# Patient Record
Sex: Female | Born: 1970 | Race: Black or African American | Hispanic: No | Marital: Single | Smoking: Never smoker
Health system: Southern US, Community
[De-identification: ages and names within clinical notes are randomized; demographics above are authoritative.]

## PROBLEM LIST (undated history)

## (undated) DIAGNOSIS — R160 Hepatomegaly, not elsewhere classified: Secondary | ICD-10-CM

## (undated) DIAGNOSIS — L309 Dermatitis, unspecified: Secondary | ICD-10-CM

## (undated) DIAGNOSIS — R519 Headache, unspecified: Secondary | ICD-10-CM

## (undated) DIAGNOSIS — L732 Hidradenitis suppurativa: Secondary | ICD-10-CM

## (undated) DIAGNOSIS — K219 Gastro-esophageal reflux disease without esophagitis: Secondary | ICD-10-CM

## (undated) DIAGNOSIS — J189 Pneumonia, unspecified organism: Secondary | ICD-10-CM

## (undated) DIAGNOSIS — K6389 Other specified diseases of intestine: Secondary | ICD-10-CM

## (undated) DIAGNOSIS — E042 Nontoxic multinodular goiter: Secondary | ICD-10-CM

## (undated) DIAGNOSIS — N200 Calculus of kidney: Secondary | ICD-10-CM

## (undated) DIAGNOSIS — K638219 Small intestinal bacterial overgrowth, unspecified: Secondary | ICD-10-CM

## (undated) HISTORY — DX: Headache, unspecified: R51.9

## (undated) HISTORY — DX: Nontoxic multinodular goiter: E04.2

## (undated) HISTORY — DX: Small intestinal bacterial overgrowth, unspecified: K63.8219

## (undated) HISTORY — DX: Gastro-esophageal reflux disease without esophagitis: K21.9

## (undated) HISTORY — DX: Hepatomegaly, not elsewhere classified: R16.0

## (undated) HISTORY — DX: Other specified diseases of intestine: K63.89

## (undated) HISTORY — DX: Pneumonia, unspecified organism: J18.9

## (undated) HISTORY — PX: ABDOMINAL HYSTERECTOMY: SHX81

---

## 2009-01-27 ENCOUNTER — Emergency Department (HOSPITAL_BASED_OUTPATIENT_CLINIC_OR_DEPARTMENT_OTHER): Admission: EM | Admit: 2009-01-27 | Discharge: 2009-01-27 | Payer: Self-pay | Admitting: Emergency Medicine

## 2009-11-22 ENCOUNTER — Emergency Department (HOSPITAL_BASED_OUTPATIENT_CLINIC_OR_DEPARTMENT_OTHER): Admission: EM | Admit: 2009-11-22 | Discharge: 2009-11-22 | Payer: Self-pay | Admitting: Emergency Medicine

## 2009-11-22 ENCOUNTER — Ambulatory Visit: Payer: Self-pay | Admitting: Diagnostic Radiology

## 2010-12-18 LAB — PREGNANCY, URINE: Preg Test, Ur: NEGATIVE

## 2010-12-18 LAB — CBC
HCT: 37.6 % (ref 36.0–46.0)
RBC: 4.55 MIL/uL (ref 3.87–5.11)
RDW: 14.1 % (ref 11.5–15.5)

## 2010-12-18 LAB — COMPREHENSIVE METABOLIC PANEL
ALT: 9 U/L (ref 0–35)
AST: 19 U/L (ref 0–37)
BUN: 8 mg/dL (ref 6–23)
Chloride: 103 mEq/L (ref 96–112)
Creatinine, Ser: 0.9 mg/dL (ref 0.4–1.2)
GFR calc Af Amer: >60 mL/min (ref >60)
GFR calc non Af Amer: >60 mL/min (ref >60)
Glucose, Bld: 64 mg/dL — ABNORMAL LOW (ref 70–99)
Sodium: 144 mEq/L (ref 135–145)
Total Bilirubin: 0.5 mg/dL (ref 0.3–1.2)
Total Protein: 8.8 g/dL — ABNORMAL HIGH (ref 6.0–8.3)

## 2010-12-18 LAB — URINALYSIS, ROUTINE W REFLEX MICROSCOPIC
Bilirubin Urine: NEGATIVE
Hgb urine dipstick: NEGATIVE
Ketones, ur: NEGATIVE mg/dL
Nitrite: NEGATIVE
Protein, ur: NEGATIVE mg/dL

## 2010-12-18 LAB — WET PREP, GENITAL: Trich, Wet Prep: NONE SEEN

## 2010-12-18 LAB — GC/CHLAMYDIA PROBE AMP, GENITAL: Chlamydia, DNA Probe: NEGATIVE

## 2010-12-18 LAB — URINE MICROSCOPIC-ADD ON

## 2010-12-18 LAB — DIFFERENTIAL
Lymphocytes Relative: 41 % (ref 12–46)
Neutrophils Relative %: 49 % (ref 43–77)

## 2010-12-30 ENCOUNTER — Observation Stay (HOSPITAL_COMMUNITY)
Admission: AD | Admit: 2010-12-30 | Discharge: 2011-01-01 | Disposition: A | Discharge status: Home / Self Care | Payer: Medicaid Other | Source: Ambulatory Visit | Attending: Obstetrics and Gynecology | Admitting: Obstetrics and Gynecology

## 2010-12-30 ENCOUNTER — Emergency Department (HOSPITAL_BASED_OUTPATIENT_CLINIC_OR_DEPARTMENT_OTHER)
Admission: EM | Admit: 2010-12-30 | Discharge: 2010-12-30 | Disposition: A | Discharge status: Home / Self Care | Payer: Medicaid Other | Attending: Emergency Medicine | Admitting: Emergency Medicine

## 2010-12-30 ENCOUNTER — Inpatient Hospital Stay (HOSPITAL_COMMUNITY): Payer: Medicaid Other

## 2010-12-30 DIAGNOSIS — B9689 Other specified bacterial agents as the cause of diseases classified elsewhere: Secondary | ICD-10-CM | POA: Insufficient documentation

## 2010-12-30 DIAGNOSIS — O2390 Unspecified genitourinary tract infection in pregnancy, unspecified trimester: Secondary | ICD-10-CM | POA: Insufficient documentation

## 2010-12-30 DIAGNOSIS — N831 Corpus luteum cyst of ovary, unspecified side: Secondary | ICD-10-CM | POA: Insufficient documentation

## 2010-12-30 DIAGNOSIS — O34599 Maternal care for other abnormalities of gravid uterus, unspecified trimester: Principal | ICD-10-CM | POA: Insufficient documentation

## 2010-12-30 DIAGNOSIS — R109 Unspecified abdominal pain: Secondary | ICD-10-CM | POA: Insufficient documentation

## 2010-12-30 DIAGNOSIS — O2690 Pregnancy related conditions, unspecified, unspecified trimester: Secondary | ICD-10-CM | POA: Insufficient documentation

## 2010-12-30 DIAGNOSIS — R1032 Left lower quadrant pain: Secondary | ICD-10-CM | POA: Insufficient documentation

## 2010-12-30 DIAGNOSIS — A499 Bacterial infection, unspecified: Secondary | ICD-10-CM | POA: Insufficient documentation

## 2010-12-30 DIAGNOSIS — N760 Acute vaginitis: Secondary | ICD-10-CM | POA: Insufficient documentation

## 2010-12-30 LAB — CBC
HCT: 34.5 % — ABNORMAL LOW (ref 36.0–46.0)
Hemoglobin: 11.5 g/dL — ABNORMAL LOW (ref 12.0–15.0)
MCH: 26.5 pg (ref 26.0–34.0)
Platelets: 323 10*3/uL (ref 150–400)
Platelets: 292 10*3/uL (ref 150–400)
RBC: 4.34 MIL/uL (ref 3.87–5.11)
WBC: 6.4 10*3/uL (ref 4.0–10.5)
WBC: 6.8 10*3/uL (ref 4.0–10.5)

## 2010-12-30 LAB — BASIC METABOLIC PANEL
BUN: 9 mg/dL (ref 6–23)
Calcium: 8.7 mg/dL (ref 8.4–10.5)
Creatinine, Ser: 0.8 mg/dL (ref 0.4–1.2)
GFR calc non Af Amer: >60 mL/min (ref >60)

## 2010-12-30 LAB — DIFFERENTIAL
Basophils Absolute: 0 10*3/uL (ref 0.0–0.1)
Basophils Relative: 0 % (ref 0–1)
Eosinophils Absolute: 0.1 10*3/uL (ref 0.0–0.7)
Eosinophils Relative: 1 % (ref 0–5)
Lymphs Abs: 3 10*3/uL (ref 0.7–4.0)
Monocytes Relative: 7 % (ref 3–12)
Neutro Abs: 2.8 10*3/uL (ref 1.7–7.7)
Neutrophils Relative %: 44 % (ref 43–77)

## 2010-12-30 LAB — URINALYSIS, ROUTINE W REFLEX MICROSCOPIC
Ketones, ur: NEGATIVE mg/dL
Protein, ur: NEGATIVE mg/dL
Specific Gravity, Urine: 1.026 (ref 1.005–1.030)

## 2010-12-30 LAB — URINE MICROSCOPIC-ADD ON

## 2010-12-30 LAB — WET PREP, GENITAL

## 2010-12-30 LAB — HCG, QUANTITATIVE, PREGNANCY: hCG, Beta Chain, Quant, S: 120 m[IU]/mL — ABNORMAL HIGH (ref <5)

## 2010-12-30 LAB — PREGNANCY, URINE: Preg Test, Ur: POSITIVE

## 2010-12-31 LAB — CBC
HCT: 33.4 % — ABNORMAL LOW (ref 36.0–46.0)
Hemoglobin: 10.4 g/dL — ABNORMAL LOW (ref 12.0–15.0)
MCHC: 31.1 g/dL (ref 30.0–36.0)
MCV: 82.9 fL (ref 78.0–100.0)
Platelets: 291 10*3/uL (ref 150–400)
RDW: 14.8 % (ref 11.5–15.5)
WBC: 6.1 10*3/uL (ref 4.0–10.5)

## 2011-01-01 LAB — CBC
Hemoglobin: 10.5 g/dL — ABNORMAL LOW (ref 12.0–15.0)
MCH: 26.4 pg (ref 26.0–34.0)
MCV: 83.4 fL (ref 78.0–100.0)
RDW: 14.8 % (ref 11.5–15.5)

## 2011-01-01 LAB — GC/CHLAMYDIA PROBE AMP, GENITAL
Chlamydia, DNA Probe: NEGATIVE
GC Probe Amp, Genital: NEGATIVE

## 2011-01-11 ENCOUNTER — Emergency Department (HOSPITAL_BASED_OUTPATIENT_CLINIC_OR_DEPARTMENT_OTHER)
Admission: EM | Admit: 2011-01-11 | Discharge: 2011-01-12 | Disposition: A | Discharge status: Home / Self Care | Payer: Medicaid Other | Attending: Emergency Medicine | Admitting: Emergency Medicine

## 2011-01-11 DIAGNOSIS — B3731 Acute candidiasis of vulva and vagina: Secondary | ICD-10-CM | POA: Insufficient documentation

## 2011-01-11 DIAGNOSIS — B373 Candidiasis of vulva and vagina: Secondary | ICD-10-CM | POA: Insufficient documentation

## 2011-01-11 DIAGNOSIS — R109 Unspecified abdominal pain: Secondary | ICD-10-CM | POA: Insufficient documentation

## 2011-01-11 LAB — URINALYSIS, ROUTINE W REFLEX MICROSCOPIC
Bilirubin Urine: NEGATIVE
Nitrite: NEGATIVE
Specific Gravity, Urine: 1.009 (ref 1.005–1.030)
Urobilinogen, UA: 0.2 mg/dL (ref 0.0–1.0)

## 2011-01-11 LAB — URINE MICROSCOPIC-ADD ON

## 2011-01-12 LAB — CBC
HCT: 33.9 % — ABNORMAL LOW (ref 36.0–46.0)
Hemoglobin: 11.3 g/dL — ABNORMAL LOW (ref 12.0–15.0)
MCH: 26.7 pg (ref 26.0–34.0)
MCHC: 33.3 g/dL (ref 30.0–36.0)
MCV: 80 fL (ref 78.0–100.0)
Platelets: 370 10*3/uL (ref 150–400)
RBC: 4.24 MIL/uL (ref 3.87–5.11)
RDW: 14.5 % (ref 11.5–15.5)
WBC: 7.7 10*3/uL (ref 4.0–10.5)

## 2011-01-12 LAB — BASIC METABOLIC PANEL
Chloride: 104 mEq/L (ref 96–112)
GFR calc Af Amer: >60 mL/min (ref >60)
Potassium: 4.4 mEq/L (ref 3.5–5.1)
Sodium: 144 mEq/L (ref 135–145)

## 2011-01-12 LAB — BASIC METABOLIC PANEL WITH GFR
BUN: 9 mg/dL (ref 6–23)
CO2: 26 meq/L (ref 19–32)
Calcium: 9.4 mg/dL (ref 8.4–10.5)
Creatinine, Ser: 0.7 mg/dL (ref 0.4–1.2)
GFR calc non Af Amer: >60 mL/min (ref >60)
Glucose, Bld: 89 mg/dL (ref 70–99)

## 2011-01-12 LAB — WET PREP, GENITAL: Trich, Wet Prep: NONE SEEN

## 2011-01-12 LAB — HCG, QUANTITATIVE, PREGNANCY: hCG, Beta Chain, Quant, S: 1962 m[IU]/mL — ABNORMAL HIGH (ref <5)

## 2011-01-12 LAB — DIFFERENTIAL
Basophils Absolute: 0 10*3/uL (ref 0.0–0.1)
Basophils Relative: 0 % (ref 0–1)
Eosinophils Absolute: 0.1 10*3/uL (ref 0.0–0.7)
Eosinophils Relative: 1 % (ref 0–5)
Lymphocytes Relative: 40 % (ref 12–46)
Lymphs Abs: 3.1 10*3/uL (ref 0.7–4.0)
Monocytes Absolute: 0.5 K/uL (ref 0.1–1.0)
Monocytes Relative: 7 % (ref 3–12)
Neutro Abs: 3.9 K/uL (ref 1.7–7.7)
Neutrophils Relative %: 51 % (ref 43–77)

## 2011-01-13 LAB — GC/CHLAMYDIA PROBE AMP, GENITAL
Chlamydia, DNA Probe: NEGATIVE
GC Probe Amp, Genital: NEGATIVE

## 2011-01-24 ENCOUNTER — Inpatient Hospital Stay (HOSPITAL_COMMUNITY)
Admission: AD | Admit: 2011-01-24 | Discharge: 2011-01-24 | Disposition: A | Discharge status: Home / Self Care | Payer: Medicaid Other | Source: Ambulatory Visit | Attending: Obstetrics and Gynecology | Admitting: Obstetrics and Gynecology

## 2011-01-24 ENCOUNTER — Inpatient Hospital Stay (HOSPITAL_COMMUNITY): Payer: Medicaid Other

## 2011-01-24 DIAGNOSIS — O200 Threatened abortion: Secondary | ICD-10-CM

## 2011-01-24 LAB — URINE MICROSCOPIC-ADD ON

## 2011-01-24 LAB — URINALYSIS, ROUTINE W REFLEX MICROSCOPIC
Leukocytes, UA: NEGATIVE
Protein, ur: NEGATIVE mg/dL
Urobilinogen, UA: 0.2 mg/dL (ref 0.0–1.0)

## 2011-01-25 LAB — URINE CULTURE: Colony Count: 4000

## 2011-01-26 ENCOUNTER — Ambulatory Visit (HOSPITAL_COMMUNITY)
Admission: AD | Admit: 2011-01-26 | Discharge: 2011-01-26 | Disposition: A | Discharge status: Home / Self Care | Payer: Medicaid Other | Source: Ambulatory Visit | Attending: Obstetrics and Gynecology | Admitting: Obstetrics and Gynecology

## 2011-01-26 ENCOUNTER — Other Ambulatory Visit: Payer: Self-pay | Admitting: Obstetrics and Gynecology

## 2011-01-26 ENCOUNTER — Ambulatory Visit (HOSPITAL_COMMUNITY)
Admission: RE | Admit: 2011-01-26 | Payer: Medicaid Other | Source: Ambulatory Visit | Admitting: Obstetrics and Gynecology

## 2011-01-26 DIAGNOSIS — O021 Missed abortion: Secondary | ICD-10-CM | POA: Insufficient documentation

## 2011-01-26 LAB — HCG, QUANTITATIVE, PREGNANCY: hCG, Beta Chain, Quant, S: 912 m[IU]/mL — ABNORMAL HIGH (ref <5)

## 2011-01-26 LAB — CBC
HCT: 35.3 % — ABNORMAL LOW (ref 36.0–46.0)
MCH: 26.4 pg (ref 26.0–34.0)
MCHC: 32 g/dL (ref 30.0–36.0)
RDW: 14.7 % (ref 11.5–15.5)

## 2011-02-01 NOTE — Op Note (Signed)
  NAME:  Christina Holmes, BACK NO.:  [id number]  MEDICAL RECORD NO.:  [id number]           PATIENT TYPE:  O  LOCATION:  [facility]                          [facility]ILITY[facility]  PHYSICIAN:  Osborn Coho, M.D.   DATE OF BIRTH:  1971/07/08  DATE OF PROCEDURE:  01/26/2011 DATE OF DISCHARGE:                              OPERATIVE REPORT   PREOPERATIVE DIAGNOSIS:  Missed abortion.  POSTOPERATIVE DIAGNOSIS:  Missed abortion.  PROCEDURE:  Suction D and C.  ATTENDING:  Osborn Coho, MD  ANESTHESIA:  MAC.  FINDINGS:  Products of conception.  SPECIMENS TO PATHOLOGY:  POCs.  FLUIDS:  500 mL.  URINE OUTPUT:  Quantity sufficient via straight cath prior to procedure.  ESTIMATED BLOOD LOSS:  Minimal.  COMPLICATIONS:  None.  PROCEDURE IN DETAIL:  The patient was taken to the operating room after the risks, benefits, and alternatives were discussed with the patient. The patient verbalized understanding.  Consent signed and witnessed. The patient was given a MAC per anesthesia and prepped and draped in normal sterile fashion in the dorsal lithotomy position.  A bivalve speculum was placed in the patient's vagina and the anterior lip of the cervix was grasped with single-tooth tenaculum.  A paracervical block was administered using a total of 10 mL of 2% lidocaine.  The uterus was sounded to about 8.5 cm and a size 7 suction curette was used.  Suction curettage was performed until minimal tissue returned.  Sharp curettage was performed until a gritty texture was noted.  Suction curettage was performed once again to remove any remaining debris.  All instruments were removed.  Count was correct. The patient tolerated the procedure well and was awaiting transfer to recovery room in good condition.     Osborn Coho, M.D.     AR/MEDQ  D:  01/26/2011  T:  Feb 17, 2011  Job:  [id number]  Electronically Signed by Osborn Coho M.D. on 02/01/2011 07:40:30 AM

## 2011-02-01 NOTE — Discharge Summary (Signed)
NAMEMarland Kitchen  Christina Holmes, Christina Holmes NO.:  [id number]  MEDICAL RECORD NO.:  [id number]           PATIENT TYPE:  O  LOCATION:  9320                          [facility]ACILITY:  [facility]  PHYSICIAN:  Janine Limbo, M.D.DATE OF BIRTH:  09-Feb-1971  DATE OF ADMISSION:  12/30/2010 DATE OF DISCHARGE:  01/01/2011                              DISCHARGE SUMMARY   ADMISSION DIAGNOSES: 1. Left lower quadrant pain. 2. A 4-week and 2-day gestation [facility] is September 05, 2011). 3. Anemia. 4. No prenatal care to date.  POSTOPERATIVE DIAGNOSES: 1. Left lower quadrant pain - now resolved. 2. A 4-week and 2-day gestation [facility] is September 05, 2011). 3. Anemia. 4. No prenatal care to date.  HISTORY OF PRESENT ILLNESS:  Christina Holmes is a 40 year old female, gravida 4, para 3-0-0-3, who presented for admission because of left lower quadrant pain.  [facility] (a Division of [facility]) was on-call for the emergency department and we were asked to see the patient.  The patient reports that her pain was of 2-day gestation and was severe.  She rated her pain as 7/10.  The patient's last menstrual period was on November 30, 2010.  She denied abnormal bleeding.  ADMISSION EXAM:  The patient's exam was significant for a patient who seemed to be in moderate distress because of pain.  The patient's pelvic exam was uneventful.  No adnexal masses were appreciated.  HOSPITAL COURSE:  The patient was admitted to the hospital and given pain medications as pills and through her IV line.  Her admission white blood cell count was 6800.  Her admission hemoglobin was 11.2 and her admission hematocrit was 34.7%.  Gonorrhea and Chlamydia cultures were negative.  The patient had a quantitative HCG performed and it was 120. The patient's blood type was B positive.  The patient was given IV fluids.  She was also given IV metronidazole because of vaginosis.  The patient quickly  began to feel better and by January 01, 2011, she felt that she was ready for discharge.  Her exam was stable.  Her pain had resolved.  Her quantitative beta HCG on December 31, 2010, was 165 and on January 01, 2011, was 220.  Her hemoglobin remained stable.  We felt that the patient was ready for discharge.  DISCHARGE MEDICATIONS: 1. Ibuprofen 600 mg every 6 hours as needed for pain. 2. Percocet 5/325, 1 or 2 tablets every 4 hours as needed for pain (20     tablets with no refills). 3. Zofran 8 mg tabs, she will take 1 tablet every 8 hours as needed     for nausea. 4. Phenergan 25 mg 1 tablet every 6 hours as needed for nausea.  DISCHARGE INSTRUCTIONS:  The patient was given a note stating that she was 4 weeks' and 4 days' pregnant with a due date of September 05, 2011. She will apply for Medicaid.  The patient was told to follow up either in our office or at the [facility] Department in 3 weeks for followup examination to begin her prenatal care.  She will take daily prenatal vitamins.  She will call for questions or concerns.     Janine Limbo, M.D.     AVS/MEDQ  D:  01/01/2011  T:  01/02/2011  Job:  [id number]  Electronically Signed by Kirkland Hun M.D. on 02/01/2011 03:10:35 AM

## 2011-02-02 ENCOUNTER — Inpatient Hospital Stay (HOSPITAL_COMMUNITY)
Admission: AD | Admit: 2011-02-02 | Discharge: 2011-02-02 | Disposition: A | Discharge status: Home / Self Care | Payer: Medicaid Other | Source: Ambulatory Visit | Attending: Obstetrics and Gynecology | Admitting: Obstetrics and Gynecology

## 2011-02-02 DIAGNOSIS — O021 Missed abortion: Secondary | ICD-10-CM | POA: Insufficient documentation

## 2011-02-02 LAB — HCG, QUANTITATIVE, PREGNANCY: hCG, Beta Chain, Quant, S: 9 m[IU]/mL — ABNORMAL HIGH (ref <5)

## 2011-02-23 DEATH — deceased

## 2011-04-07 ENCOUNTER — Encounter: Payer: Self-pay | Admitting: *Deleted

## 2011-04-07 ENCOUNTER — Emergency Department (INDEPENDENT_AMBULATORY_CARE_PROVIDER_SITE_OTHER): Payer: Medicaid Other

## 2011-04-07 ENCOUNTER — Emergency Department (HOSPITAL_BASED_OUTPATIENT_CLINIC_OR_DEPARTMENT_OTHER)
Admission: EM | Admit: 2011-04-07 | Discharge: 2011-04-07 | Disposition: A | Discharge status: Home / Self Care | Payer: Medicaid Other | Attending: Emergency Medicine | Admitting: Emergency Medicine

## 2011-04-07 DIAGNOSIS — R10814 Left lower quadrant abdominal tenderness: Secondary | ICD-10-CM | POA: Insufficient documentation

## 2011-04-07 DIAGNOSIS — R109 Unspecified abdominal pain: Secondary | ICD-10-CM

## 2011-04-07 DIAGNOSIS — R350 Frequency of micturition: Secondary | ICD-10-CM | POA: Insufficient documentation

## 2011-04-07 DIAGNOSIS — N2889 Other specified disorders of kidney and ureter: Secondary | ICD-10-CM

## 2011-04-07 DIAGNOSIS — N210 Calculus in bladder: Secondary | ICD-10-CM

## 2011-04-07 DIAGNOSIS — R112 Nausea with vomiting, unspecified: Secondary | ICD-10-CM

## 2011-04-07 DIAGNOSIS — N200 Calculus of kidney: Secondary | ICD-10-CM | POA: Insufficient documentation

## 2011-04-07 DIAGNOSIS — R1032 Left lower quadrant pain: Secondary | ICD-10-CM | POA: Insufficient documentation

## 2011-04-07 DIAGNOSIS — R300 Dysuria: Secondary | ICD-10-CM | POA: Insufficient documentation

## 2011-04-07 DIAGNOSIS — R42 Dizziness and giddiness: Secondary | ICD-10-CM | POA: Insufficient documentation

## 2011-04-07 LAB — DIFFERENTIAL
Basophils Absolute: 0 10*3/uL (ref 0.0–0.1)
Basophils Relative: 0 % (ref 0–1)
Eosinophils Absolute: 0 10*3/uL (ref 0.0–0.7)
Eosinophils Relative: 1 % (ref 0–5)

## 2011-04-07 LAB — CBC
MCH: 26.2 pg (ref 26.0–34.0)
MCHC: 33.1 g/dL (ref 30.0–36.0)
MCV: 79.4 fL (ref 78.0–100.0)
Platelets: 279 10*3/uL (ref 150–400)
RDW: 13.8 % (ref 11.5–15.5)
WBC: 6.3 10*3/uL (ref 4.0–10.5)

## 2011-04-07 LAB — URINALYSIS, ROUTINE W REFLEX MICROSCOPIC
Bilirubin Urine: NEGATIVE
Ketones, ur: NEGATIVE mg/dL
Nitrite: NEGATIVE
pH: 6.5 (ref 5.0–8.0)

## 2011-04-07 LAB — URINE MICROSCOPIC-ADD ON

## 2011-04-07 LAB — BASIC METABOLIC PANEL
Calcium: 9.3 mg/dL (ref 8.4–10.5)
GFR calc non Af Amer: >60 mL/min (ref >60)
Sodium: 139 mEq/L (ref 135–145)

## 2011-04-07 MED ORDER — OXYCODONE-ACETAMINOPHEN 5-325 MG PO TABS: 2.0000 | ORAL_TABLET | ORAL | Status: AC | PRN

## 2011-04-07 MED ORDER — HYDROMORPHONE HCL 1 MG/ML IJ SOLN: 2.0000 mg | Freq: Once | INTRAMUSCULAR | Status: DC

## 2011-04-07 MED ORDER — IBUPROFEN 800 MG PO TABS: 800.0000 mg | ORAL_TABLET | Freq: Three times a day (TID) | ORAL | Status: AC | PRN

## 2011-04-07 MED ORDER — ONDANSETRON 4 MG PO TBDP
4.0000 mg | ORAL_TABLET | Freq: Once | ORAL | Status: AC
  Administered 2011-04-07: 4 mg via ORAL
  Filled 2011-04-07: qty 1

## 2011-04-07 MED ORDER — KETOROLAC TROMETHAMINE 60 MG/2ML IM SOLN
60.0000 mg | Freq: Once | INTRAMUSCULAR | Status: AC
  Administered 2011-04-07: 60 mg via INTRAMUSCULAR
  Filled 2011-04-07: qty 2

## 2011-04-07 MED ORDER — SODIUM CHLORIDE 0.9 % IV BOLUS (SEPSIS)
1000.0000 mL | Freq: Once | INTRAVENOUS | Status: AC
  Administered 2011-04-07: 1000 mL via INTRAVENOUS

## 2011-04-07 MED ORDER — ONDANSETRON HCL 4 MG/2ML IJ SOLN: 4.0000 mg | Freq: Once | INTRAMUSCULAR | Status: DC

## 2011-04-07 MED ORDER — HYDROMORPHONE HCL 1 MG/ML IJ SOLN
1.0000 mg | INTRAMUSCULAR | Status: DC | PRN
  Administered 2011-04-07: 1 mg via INTRAVENOUS
  Filled 2011-04-07: qty 1

## 2011-04-07 MED ORDER — ONDANSETRON HCL 4 MG PO TABS: 8.0000 mg | ORAL_TABLET | Freq: Three times a day (TID) | ORAL | Status: AC | PRN

## 2011-04-07 MED ORDER — IOHEXOL 300 MG/ML  SOLN
100.0000 mL | Freq: Once | INTRAMUSCULAR | Status: AC | PRN
  Administered 2011-04-07: 100 mL via INTRAVENOUS

## 2011-04-07 NOTE — ED Notes (Signed)
Pt aware of plans for CT abdomen. Call bell within reach and instructed to notify staff if pain/nausea worsens. Verbalizes understanding.

## 2011-04-07 NOTE — ED Provider Notes (Addendum)
History     Chief Complaint  Patient presents with  . Abdominal Pain  . Nausea  . Emesis   Patient is a 40 y.o. female presenting with abdominal pain and vomiting. The history is provided by the patient and medical records.  Abdominal Pain The primary symptoms of the illness include abdominal pain, nausea, vomiting and dysuria. The primary symptoms of the illness do not include fever, shortness of breath, diarrhea, hematemesis, hematochezia, vaginal discharge or vaginal bleeding. Episode onset: 1 AM. The onset of the illness was sudden. The problem has not changed since onset. The pain came on suddenly. The abdominal pain has been unchanged since its onset. The abdominal pain is located in the LLQ. The abdominal pain radiates to the left flank. The severity of the abdominal pain is 8/10. The abdominal pain is relieved by nothing.  The dysuria is associated with frequency. The dysuria is not associated with hematuria.  The patient states that she believes she is currently not pregnant. The patient has not had a change in bowel habit. Additional symptoms associated with the illness include frequency. Symptoms associated with the illness do not include chills, hematuria or back pain. Associated medical issues comments: Recent dilation and evacuation for miscarriage.  Emesis  Associated symptoms include abdominal pain. Pertinent negatives include no chills, no diarrhea, no fever and no headaches.    History reviewed. No pertinent past medical history.  History reviewed. No pertinent past surgical history.  History reviewed. No pertinent family history.  History  Substance Use Topics  . Smoking status: Never Smoker   . Smokeless tobacco: Not on file  . Alcohol Use: No    OB History    Grav Para Term Preterm Abortions TAB SAB Ect Mult Living                  Review of Systems  Constitutional: Negative for fever and chills.  HENT: Negative for neck pain.   Eyes: Negative for visual  disturbance.  Respiratory: Negative for shortness of breath.   Cardiovascular: Negative for chest pain.  Gastrointestinal: Positive for nausea, vomiting and abdominal pain. Negative for diarrhea, hematochezia and hematemesis.  Genitourinary: Positive for dysuria and frequency. Negative for hematuria, vaginal bleeding, vaginal discharge and menstrual problem.  Musculoskeletal: Negative for back pain.  Skin: Negative for rash.  Neurological: Positive for dizziness. Negative for headaches.  Hematological: Negative for adenopathy.    Physical Exam  BP 124/97  Pulse 98  Temp(Src) 97.7 F (36.5 C) (Oral)  SpO2 100%  Physical Exam  Nursing note and vitals reviewed. Constitutional: She appears well-developed and well-nourished. Distressed:  Uncomfortable appearing.  HENT:  Head: Normocephalic and atraumatic.  Mouth/Throat: Oropharynx is clear and moist. No oropharyngeal exudate.  Eyes: Conjunctivae and EOM are normal. Pupils are equal, round, and reactive to light. Right eye exhibits no discharge. Left eye exhibits no discharge. No scleral icterus.  Neck: Normal range of motion. Neck supple. No JVD present. No thyromegaly present.  Cardiovascular: Normal rate, regular rhythm, normal heart sounds and intact distal pulses.  Exam reveals no gallop and no friction rub.   No murmur heard. Pulmonary/Chest: Effort normal and breath sounds normal. No respiratory distress. She has no wheezes. She has no rales.  Abdominal: Soft. Bowel sounds are normal. She exhibits no distension and no mass. There is Tenderness:  Tender to palpation in the left lower quadrant, suprapubic area.. There is no CVA tenderness.       No tenderness in right lower cultures,  McBurney's point, no Murphy sign, non-peritoneal, no guarding, no upper normal tenderness at all.  Musculoskeletal: Normal range of motion. She exhibits no edema and no tenderness.  Lymphadenopathy:    She has no cervical adenopathy.  Neurological: She  is alert. Coordination normal.  Skin: Skin is warm and dry. No rash noted. She is not diaphoretic. No erythema.  Psychiatric: She has a normal mood and affect. Her behavior is normal.     ED Course  Procedures  MDM The patient appears in mild discomfort and has reproducible symptoms to palpation of the left lower abdomen. Will get urinalysis to rule out urine infection and to evaluate for hematuria. Toradol intramuscular given. Will do further evaluation if urinalysis negative. Would also consider ovarian cyst or kidney stone.   Urinalysis negative, pelvic exam performed under chaperone supervision by nurse Lurena Joiner. Patient has bilateral adnexal tenderness as well as pain over her uterus and cervix. There is no discharge or bleeding in the vaginal vault. Due to the ongoing pain and lack of ultrasound availability at this facility at this time of day, will get a CT scan to rule out other sources of pain. Dilaudid IV ordered as well as blood work.   Review of medical record shows that patient has had persistently negative GC and Chlamydia swabs but has had persistently positive bacterial vaginosis over the last several pelvic exams. She denies sexual activity since her miscarriage in the last 2 months.  Change of shift - care signed out to Dr. Fredricka Bonine.  Vida Roller, MD 04/07/11 [phone]  The patient has a chief complaint of abdominal pain left lower quadrant and suprapubic in location, and at this time is awaiting results of CT scan to evaluate for ureteric lithiasis or other abdominal pathology. If CT scan is negative, Dr. Hyacinth Meeker informs me that the patient should be discharged home with the diagnosis of nonspecific abdominal pain. I will followup on the results of the CT scan for the continued care of this patient.  Felisa Bonier, MD 04/07/11 0802  CT scan reviewed by myself as well as the radiologist, finding obstructive uropathy due to an approximately 5 mm ureteric stone now located  just inside the bladder lumen.  Felisa Bonier, MD 04/07/11 [phone]  I have conveyed the results of testing to the patient and given her diagnosis of ureteric stone, however she is at this time in approximately 9/10 intensity pain, and nauseated. I will order her repeat doses of analgesia and antiemetics to try and help her to get more comfortable prior to discharge home with urology followup.  Felisa Bonier, MD 04/07/11 [phone]  At this time the patient reports the pain has improved spontaneously, and she actually declined to be ordered pain medication for her. She requested that her IV be removed and that she be discharged at this time.  Felisa Bonier, MD 04/07/11 [phone]

## 2011-04-07 NOTE — ED Notes (Signed)
Patient is resting comfortably. No distress noted at this time.

## 2011-04-07 NOTE — ED Notes (Signed)
Pt reports urinary frequency x1week. Denies other urinary symptoms or vaginal discharge. Approx 4hours ago, had sudden onset of LLQ, left flank and suprapubic pain. Described as sharp.

## 2011-04-07 NOTE — ED Notes (Signed)
Pt very anxious and dry heaving in room. Pt encouraged on relaxation techniques and to slow breathing.

## 2011-04-09 ENCOUNTER — Emergency Department (HOSPITAL_BASED_OUTPATIENT_CLINIC_OR_DEPARTMENT_OTHER)
Admission: EM | Admit: 2011-04-09 | Discharge: 2011-04-09 | Discharge status: Against Medical Advice | Payer: Medicaid Other | Attending: Emergency Medicine | Admitting: Emergency Medicine

## 2011-04-09 ENCOUNTER — Emergency Department (INDEPENDENT_AMBULATORY_CARE_PROVIDER_SITE_OTHER): Payer: Medicaid Other

## 2011-04-09 ENCOUNTER — Encounter (HOSPITAL_BASED_OUTPATIENT_CLINIC_OR_DEPARTMENT_OTHER): Payer: Self-pay | Admitting: *Deleted

## 2011-04-09 DIAGNOSIS — N201 Calculus of ureter: Secondary | ICD-10-CM

## 2011-04-09 DIAGNOSIS — R109 Unspecified abdominal pain: Secondary | ICD-10-CM

## 2011-04-09 DIAGNOSIS — N200 Calculus of kidney: Secondary | ICD-10-CM | POA: Insufficient documentation

## 2011-04-09 HISTORY — DX: Calculus of kidney: N20.0

## 2011-04-09 LAB — GC/CHLAMYDIA PROBE AMP, GENITAL
Chlamydia, DNA Probe: NEGATIVE
GC Probe Amp, Genital: NEGATIVE

## 2011-04-09 LAB — URINALYSIS, ROUTINE W REFLEX MICROSCOPIC
Bilirubin Urine: NEGATIVE
Nitrite: NEGATIVE
Specific Gravity, Urine: 1.011 (ref 1.005–1.030)
pH: 6 (ref 5.0–8.0)

## 2011-04-09 LAB — URINE MICROSCOPIC-ADD ON

## 2011-04-09 MED ORDER — HYDROMORPHONE HCL 2 MG/ML IJ SOLN
2.0000 mg | Freq: Once | INTRAMUSCULAR | Status: DC
  Filled 2011-04-09: qty 1

## 2011-04-09 MED ORDER — SODIUM CHLORIDE 0.9 % IV SOLN
Freq: Once | INTRAVENOUS | Status: AC
  Administered 2011-04-09: 02:00:00 via INTRAVENOUS

## 2011-04-09 NOTE — ED Notes (Signed)
MD at bedside. 

## 2011-04-09 NOTE — ED Notes (Signed)
Pt states that she was seen on Sat and dx with kidney stone pt states that pain began and worsened today around 1600 pt also with irritated left eye

## 2011-04-09 NOTE — ED Notes (Signed)
Went to pt's room to give her Dilaudid and to draw her labs. She stated that she did not want a shot, nor did she want any labs drawn. She did agree to the CT because she "wants to see if that thing is still inside her." Dr. Algis Downs and in to talk with pt.

## 2011-04-09 NOTE — ED Provider Notes (Signed)
History     Chief Complaint  Patient presents with  . Flank Pain   Patient is a 40 y.o. female presenting with flank pain. The history is provided by the patient.  Flank Pain This is a new problem. The current episode started 2 days ago. The problem occurs constantly. The problem has not changed since onset.Associated symptoms include abdominal pain. Pertinent negatives include no chest pain and no shortness of breath. The symptoms are aggravated by nothing. The symptoms are relieved by nothing.  SEEN Saturday FOR SAME AND CT DEPICTED LEFT 4 MM KIDNEY STONE IN DISTAL URETER AT BLADDER. PAIN HAS CONTINUED. HAS BEEN TAKING PAIN MEDS WITH SOME RELIEF. HERE BECAUSE STILL NOT BETTER.   Past Medical History  Diagnosis Date  . Kidney calculi     History reviewed. No pertinent past surgical history.  History reviewed. No pertinent family history.  History  Substance Use Topics  . Smoking status: Never Smoker   . Smokeless tobacco: Not on file  . Alcohol Use: No    OB History    Grav Para Term Preterm Abortions TAB SAB Ect Mult Living                  Review of Systems  Constitutional: Negative for fever.  HENT: Negative for congestion and neck pain.   Respiratory: Negative for chest tightness and shortness of breath.   Cardiovascular: Negative for chest pain.  Gastrointestinal: Positive for nausea and abdominal pain. Negative for vomiting.  Genitourinary: Positive for flank pain. Negative for difficulty urinating.  Musculoskeletal: Positive for back pain.  Skin: Negative for rash.  Neurological: Negative for weakness.    Physical Exam  BP 114/86  Pulse 90  Temp(Src) 97.9 F (36.6 C) (Oral)  Resp 20  SpO2 100%  Physical Exam  Constitutional: She is oriented to person, place, and time. She appears well-developed and well-nourished.  HENT:  Head: Normocephalic and atraumatic.  Mouth/Throat: Oropharynx is clear and moist.  Eyes: Conjunctivae and EOM are normal. Pupils  are equal, round, and reactive to light.  Neck: Normal range of motion. Neck supple.  Cardiovascular: Normal rate, regular rhythm, normal heart sounds and intact distal pulses.   Pulmonary/Chest: Effort normal and breath sounds normal.  Abdominal: Soft. Bowel sounds are normal. There is no tenderness.  Musculoskeletal: Normal range of motion. She exhibits no edema.  Neurological: She is alert and oriented to person, place, and time. No cranial nerve deficit. She exhibits normal muscle tone.  Skin: Skin is warm and dry. No rash noted.    ED Course  Procedures Results for orders placed during the hospital encounter of 04/09/11  URINALYSIS, ROUTINE W REFLEX MICROSCOPIC      Component Value Range   Color, Urine YELLOW  YELLOW    Appearance CLEAR  CLEAR    Specific Gravity, Urine 1.011  1.005 - 1.030    pH 6.0  5.0 - 8.0    Glucose, UA NEGATIVE  NEGATIVE (mg/dL)   Hgb urine dipstick MODERATE (*) NEGATIVE    Bilirubin Urine NEGATIVE  NEGATIVE    Ketones, ur NEGATIVE  NEGATIVE (mg/dL)   Protein, ur NEGATIVE  NEGATIVE (mg/dL)   Urobilinogen, UA 0.2  0.0 - 1.0 (mg/dL)   Nitrite NEGATIVE  NEGATIVE    Leukocytes, UA NEGATIVE  NEGATIVE   URINE MICROSCOPIC-ADD ON      Component Value Range   Squamous Epithelial / LPF FEW (*) RARE    WBC, UA 0-2  <3 (WBC/hpf)  RBC / HPF 7-10  <3 (RBC/hpf)   Bacteria, UA FEW (*) RARE    Ct Abdomen Pelvis Wo Contrast  04/09/2011  *RADIOLOGY REPORT*  Clinical Data: 40 year old female with flank pain.  Recent diagnosis of obstructive uropathy due to left UVJ stone.  CT ABDOMEN AND PELVIS WITHOUT CONTRAST  Technique:  Multidetector CT imaging of the abdomen and pelvis was performed following the standard protocol without intravenous contrast.  Comparison: 04/07/2011.  Findings: Lung base is not included. No acute osseous abnormality identified.  No pelvic free fluid.  Stable appearance of the uterus and adnexa.  Negative distal colon.  Negative proximal colon, cecum  and appendix.  No dilated small bowel.  Decompressed stomach. Visualized noncontrast liver, spleen, pancreas, and adrenal glands are within normal limits.  There is now hyperdensity within the gallbladder, favor vicarious excretion of recent IV contrast.  Negative right kidney and ureter.  Continued obstruction of the left kidney with mild perinephric stranding.  Continued left hydroureter, ureter diameter up to 9 mm. Persistent obstructing calculus at the left ureteral vesicle junction measuring 4 mm in diameter.  Negative bladder otherwise.  IMPRESSION: Unchanged 4 mm UVJ stone causing obstruction of the left kidney and ureter.  Original Report Authenticated By: Harley Hallmark, M.D.   Ct Abdomen Pelvis W Contrast  04/07/2011  *RADIOLOGY REPORT*  Clinical Data:  Abdominal pain, nausea, vomiting  CT ABDOMEN AND PELVIS WITH CONTRAST  Technique:  Multidetector CT imaging of the abdomen and pelvis was performed using the standard protocol following bolus administration of intravenous contrast.  Contrast: 100 ml Omnipaque-300  Comparison:  None.  Findings:  The visualized lung bases are clear.  Bone windows reveal no acute musculoskeletal abnormalities.  The liver, gallbladder, spleen, pancreas, adrenal glands, aorta, and right kidney are normal.  Bowel is normal.  Reproductive organs are normal.  There is a 1 cm right ovarian follicle.  There is no ascites.  There is a small volume of perinephric fluid on the left.  There is perinephric inflammatory change.  There is moderate to severe dilatation of the left ureter throughout its course.  Within the bladder lumen just inside the ureteral vesicle junction, there is a 5 mm calculus.  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  Original Report Authenticated By: [id number]   MDM LEFT URETERAL STONE WITH OBSTRUCTION AND NO MOVEMENT SINCE Saturday. PATIENT HAS CONSISITENTLY  REFUSED LABS TO EVAL WBC FOR INFECTION AND RENAL FUNCTION. EXPLAINED THAT IN THE FACE OF THE OBSTRUCTION THIS IS EXTREMELY IMPORTANT SHE UNDERSTANDS AND DOES NOT WANT IT. STATES SHE WILL FOLLOW UP WITH UROLOGY. PAIN MEDS OFFERED BUT REFUSED.       Shelda Jakes, MD 04/09/11 [phone]

## 2011-05-02 ENCOUNTER — Encounter (HOSPITAL_BASED_OUTPATIENT_CLINIC_OR_DEPARTMENT_OTHER): Payer: Self-pay | Admitting: *Deleted

## 2011-05-02 ENCOUNTER — Emergency Department (HOSPITAL_BASED_OUTPATIENT_CLINIC_OR_DEPARTMENT_OTHER)
Admission: EM | Admit: 2011-05-02 | Discharge: 2011-05-02 | Disposition: A | Discharge status: Home / Self Care | Payer: Medicaid Other | Attending: Emergency Medicine | Admitting: Emergency Medicine

## 2011-05-02 DIAGNOSIS — IMO0001 Reserved for inherently not codable concepts without codable children: Secondary | ICD-10-CM | POA: Insufficient documentation

## 2011-05-02 DIAGNOSIS — S40862A Insect bite (nonvenomous) of left upper arm, initial encounter: Secondary | ICD-10-CM

## 2011-05-02 MED ORDER — DIPHENHYDRAMINE HCL 25 MG PO CAPS
25.0000 mg | ORAL_CAPSULE | Freq: Once | ORAL | Status: AC
  Administered 2011-05-02: 25 mg via ORAL
  Filled 2011-05-02 (×2): qty 1

## 2011-05-02 MED ORDER — METHYLPREDNISOLONE SODIUM SUCC 125 MG IJ SOLR
125.0000 mg | Freq: Once | INTRAMUSCULAR | Status: AC
  Administered 2011-05-02: 125 mg via INTRAMUSCULAR
  Filled 2011-05-02: qty 2

## 2011-05-02 MED ORDER — HYDROCODONE-ACETAMINOPHEN 5-325 MG PO TABS
1.0000 | ORAL_TABLET | Freq: Once | ORAL | Status: AC
  Administered 2011-05-02: 1 via ORAL
  Filled 2011-05-02: qty 1

## 2011-05-02 NOTE — ED Provider Notes (Signed)
History     CSN: [id number] Arrival date & time: 05/02/2011  9:01 PM  Chief Complaint  Patient presents with  . Insect Bite   HPI Comments: Pt states that she hasn't tried anything and she is having redness and swelling to the area  Patient is a 40 y.o. female presenting with animal bite. The history is provided by the patient. No language interpreter was used.  Animal Bite  The incident occurred today. There is an injury to the left upper arm. The pain is moderate. It is unlikely that a foreign body is present. Pertinent negatives include no nausea, no vomiting, no cough and no difficulty breathing.    Past Medical History  Diagnosis Date  . Kidney calculi     History reviewed. No pertinent past surgical history.  History reviewed. No pertinent family history.  History  Substance Use Topics  . Smoking status: Never Smoker   . Smokeless tobacco: Not on file  . Alcohol Use: No    OB History    Grav Para Term Preterm Abortions TAB SAB Ect Mult Living                  Review of Systems  Constitutional: Negative.   Respiratory: Negative for cough.   Gastrointestinal: Negative for nausea and vomiting.  All other systems reviewed and are negative.    Physical Exam  BP 116/66  Pulse 72  Temp 98.3 F (36.8 C)  Resp 16  Wt 180 lb (81.647 kg)  SpO2 100%  LMP 04/05/2011  Physical Exam  Constitutional: She appears well-developed and well-nourished.  HENT:  Head: Normocephalic and atraumatic.  Cardiovascular: Normal rate and regular rhythm.   Pulmonary/Chest: Effort normal.  Musculoskeletal: Normal range of motion.  Neurological: She is alert.  Skin:       ED Course  Procedures  MDM Pt has a localized reaction to the left arm  Medical screening examination/treatment/procedure(s) were performed by non-physician practitioner and as supervising physician I was immediately available for consultation/collaboration.    Teressa Lower, NP 05/02/11  2228  Shelda Jakes, MD 05/13/11 [phone]

## 2011-05-02 NOTE — ED Notes (Signed)
Pt c/o bee sting to left arm x 45 mins ago

## 2012-02-10 IMAGING — US US OB TRANSVAGINAL
1 series · 13 of 28 positions shown · non-contrast
Comparison: none

[Series 1: us ob comp less 14 wks · 13 of 41 slices shown]
[im 2/41]
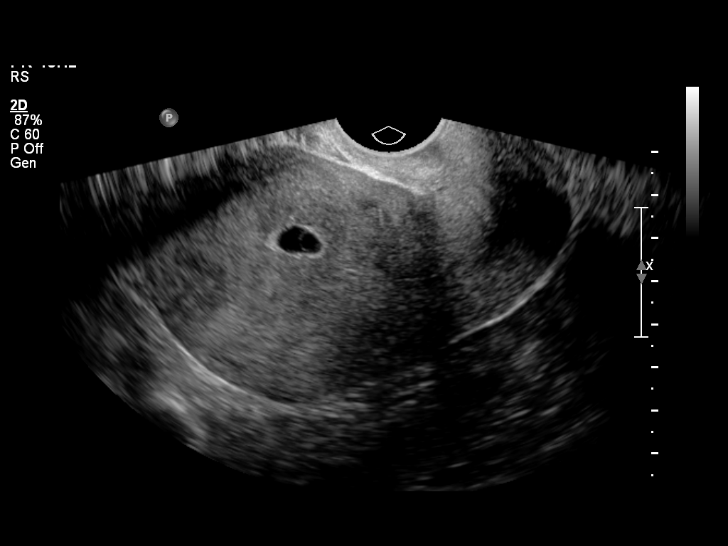
[im 5/41]
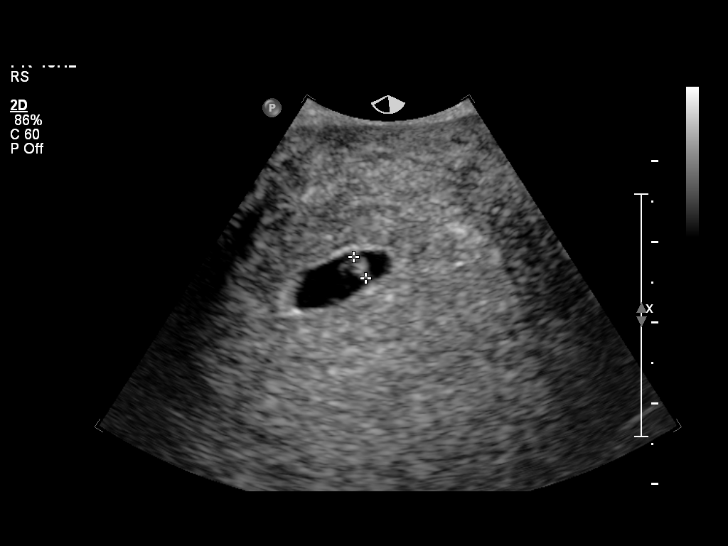
[im 8/41]
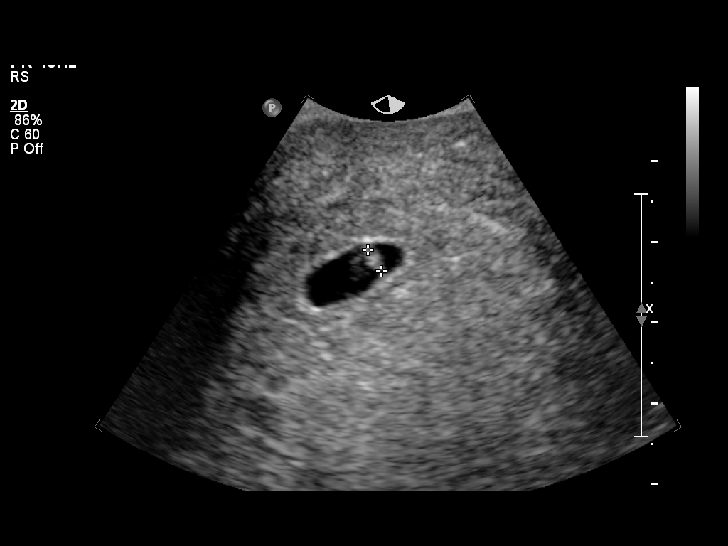
[im 11/41]
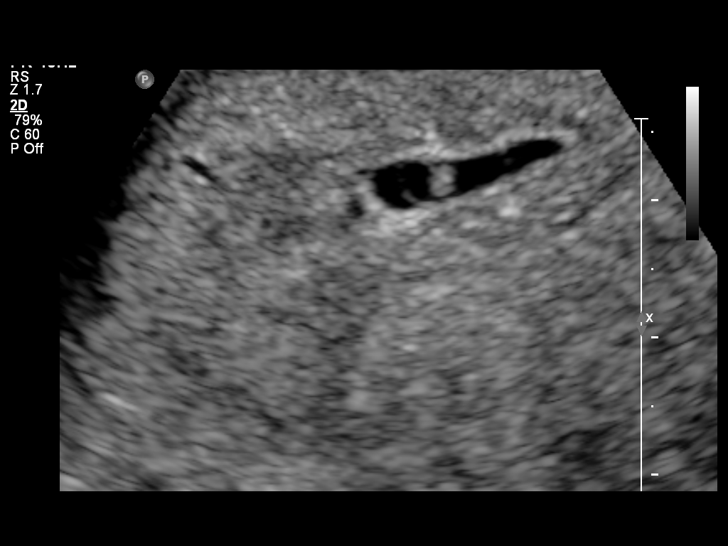
[im 14/41]
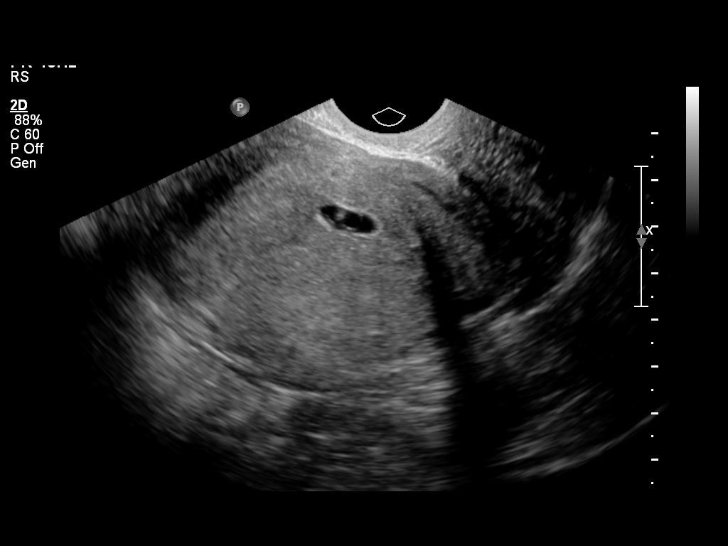
[im 17/41]
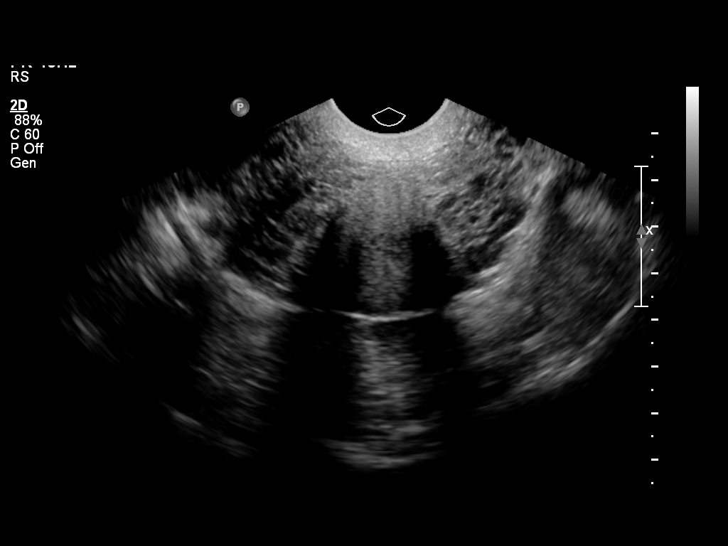
[im 21/41]
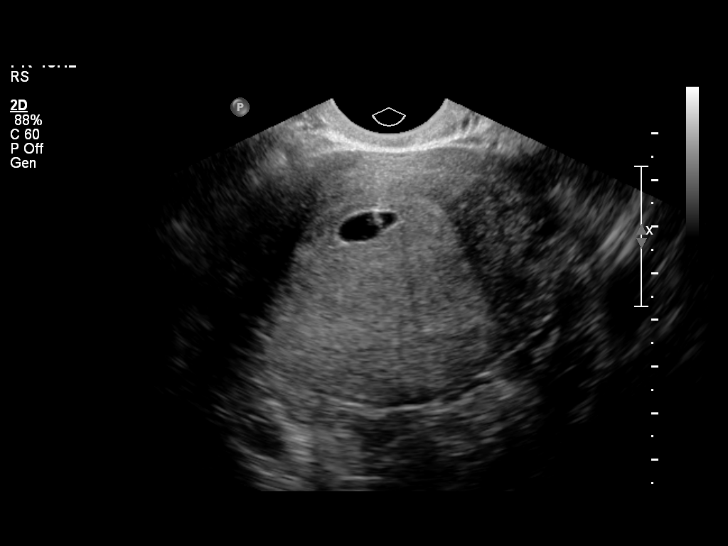
[im 24/41]
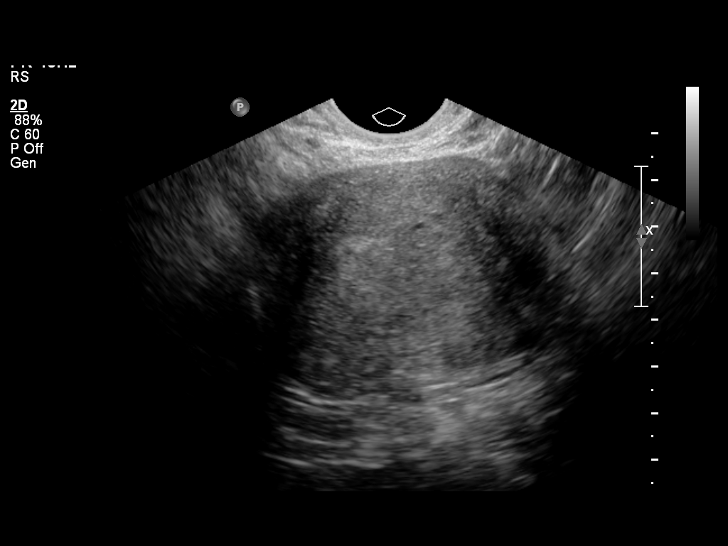
[im 27/41]
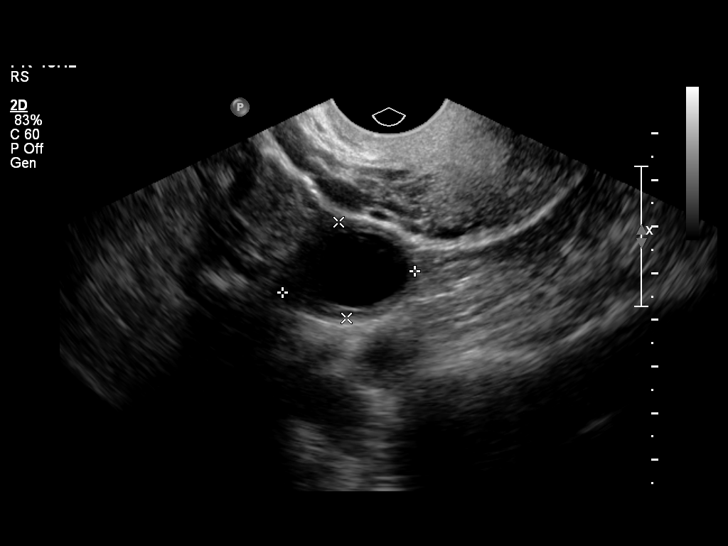
[im 30/41]
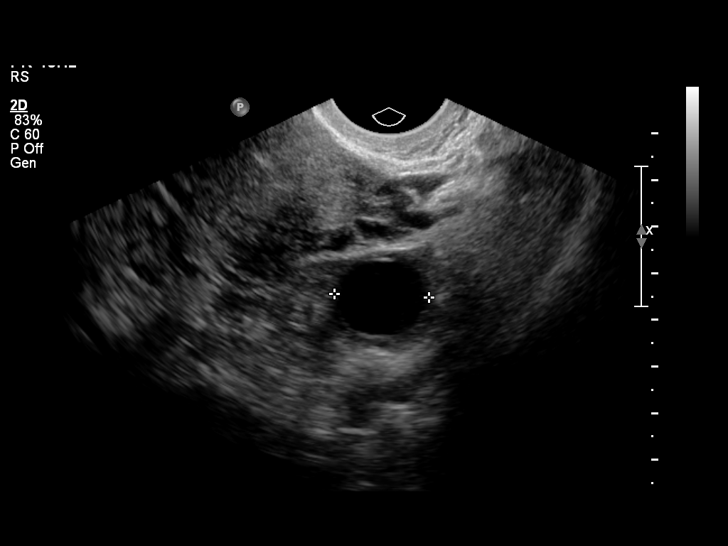
[im 33/41]
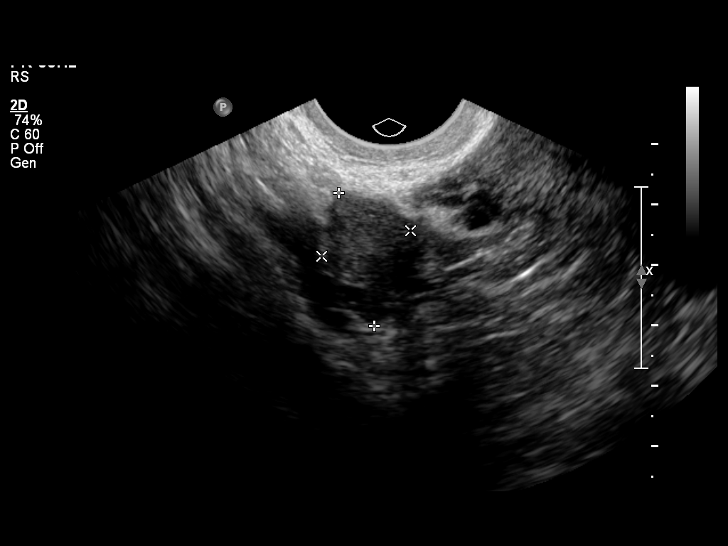
[im 36/41]
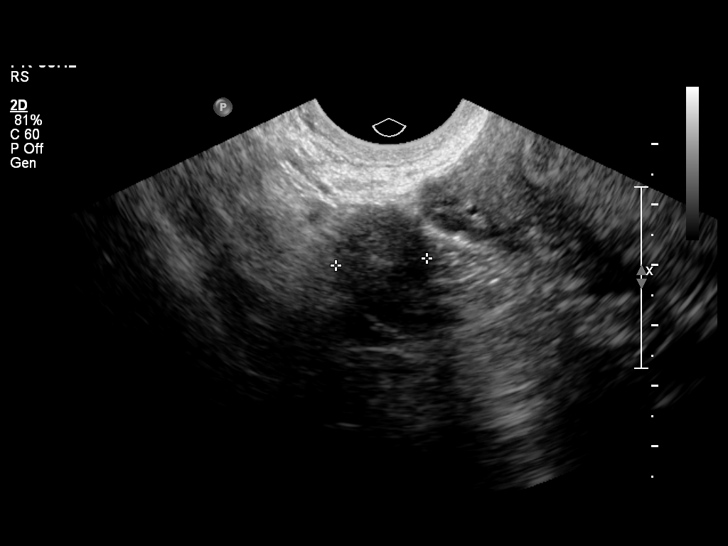
[im 39/41]
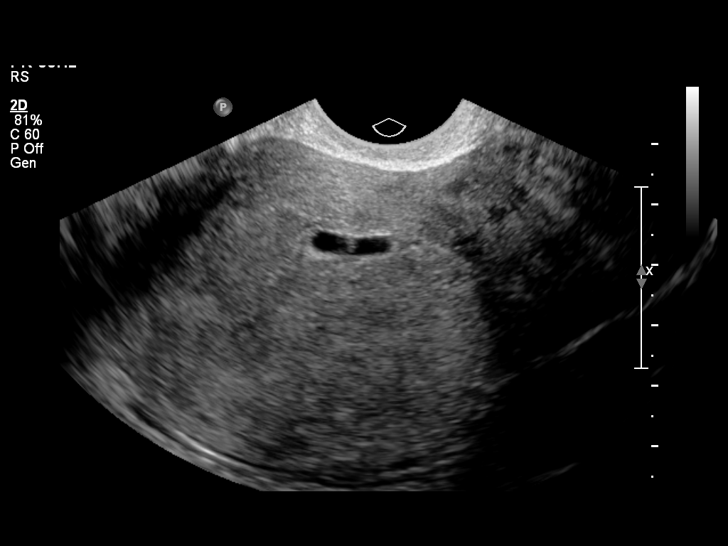

[13 of 28 positions shown; findings below may reference images not displayed]

OBSTETRICS REPORT
                      (Signed Final 01/24/2011 [DATE])

Procedures

 US OB TRANSVAGINAL                                    76817.0
Indications

 Pelvic / Abdominal pain
 Vaginal bleeding, unknown etiology
Fetal Evaluation

 Preg. Location:    Intrauterine
 Gest. Sac:         Intrauterine
 Yolk Sac:          Visualized
 Fetal Pole:        Visualized
 Cardiac Activity:  Absent

 Amniotic Fluid
 AFI FV:      Subjectively decreased
Biometry

 CRL:      2.9  mm     G. Age:  5w 6d                  EDD:    09/20/11
Gestational Age

 LMP:           7w 6d         Date:  11/30/10                 EDD:   09/06/11
 Best:          7w 6d      Det. By:  LMP  (11/30/10)          EDD:   09/06/11
Cervix Uterus Adnexa

 Cervix:       Closed.
 Cul De Sac:   No free fluid seen.
 Left Ovary:    Within normal limits. Small corpus luteum noted.
                3.0cm x 1.8cm x 1.5cm
 Right Ovary:   Within normal limits. 2.3cm x 1.5cm x  1.5cm
 Adnexa:     No abnormality visualized.
Impression

 Single 6wk IUP visualized, but no visible cardiac activity and
 thin choriodecidual reaction are poor prognostic signs.
 No adnexal mass or free fluid identified.
Recommendations

 Follow-up US in 5 days to assess pregnancy
 progression/viability.

## 2012-09-02 ENCOUNTER — Emergency Department (HOSPITAL_BASED_OUTPATIENT_CLINIC_OR_DEPARTMENT_OTHER)
Admission: EM | Admit: 2012-09-02 | Discharge: 2012-09-02 | Disposition: A | Discharge status: Home / Self Care | Payer: Self-pay | Attending: Emergency Medicine | Admitting: Emergency Medicine

## 2012-09-02 ENCOUNTER — Encounter (HOSPITAL_BASED_OUTPATIENT_CLINIC_OR_DEPARTMENT_OTHER): Payer: Self-pay | Admitting: Family Medicine

## 2012-09-02 ENCOUNTER — Emergency Department (HOSPITAL_BASED_OUTPATIENT_CLINIC_OR_DEPARTMENT_OTHER): Payer: Self-pay

## 2012-09-02 DIAGNOSIS — N390 Urinary tract infection, site not specified: Secondary | ICD-10-CM | POA: Insufficient documentation

## 2012-09-02 DIAGNOSIS — Z9889 Other specified postprocedural states: Secondary | ICD-10-CM | POA: Insufficient documentation

## 2012-09-02 DIAGNOSIS — R6883 Chills (without fever): Secondary | ICD-10-CM | POA: Insufficient documentation

## 2012-09-02 DIAGNOSIS — R109 Unspecified abdominal pain: Secondary | ICD-10-CM | POA: Insufficient documentation

## 2012-09-02 DIAGNOSIS — R42 Dizziness and giddiness: Secondary | ICD-10-CM | POA: Insufficient documentation

## 2012-09-02 DIAGNOSIS — B9689 Other specified bacterial agents as the cause of diseases classified elsewhere: Secondary | ICD-10-CM

## 2012-09-02 DIAGNOSIS — Z87442 Personal history of urinary calculi: Secondary | ICD-10-CM | POA: Insufficient documentation

## 2012-09-02 DIAGNOSIS — N760 Acute vaginitis: Secondary | ICD-10-CM | POA: Insufficient documentation

## 2012-09-02 LAB — CBC WITH DIFFERENTIAL/PLATELET
Basophils Relative: 0 % (ref 0–1)
Eosinophils Relative: 1 % (ref 0–5)
HCT: 34.8 % — ABNORMAL LOW (ref 36.0–46.0)
Hemoglobin: 11.8 g/dL — ABNORMAL LOW (ref 12.0–15.0)
Lymphocytes Relative: 39 % (ref 12–46)
Lymphs Abs: 1.1 10*3/uL (ref 0.7–4.0)
MCV: 81.3 fL (ref 78.0–100.0)
Monocytes Relative: 13 % — ABNORMAL HIGH (ref 3–12)
Neutro Abs: 1.4 10*3/uL — ABNORMAL LOW (ref 1.7–7.7)
RBC: 4.28 MIL/uL (ref 3.87–5.11)
RDW: 13.2 % (ref 11.5–15.5)
WBC: 2.9 10*3/uL — ABNORMAL LOW (ref 4.0–10.5)

## 2012-09-02 LAB — COMPREHENSIVE METABOLIC PANEL
ALT: 8 U/L (ref 0–35)
Alkaline Phosphatase: 44 U/L (ref 39–117)
CO2: 24 mEq/L (ref 19–32)
Chloride: 104 mEq/L (ref 96–112)
GFR calc Af Amer: >90 mL/min (ref >90)
GFR calc non Af Amer: >90 mL/min (ref >90)
Glucose, Bld: 84 mg/dL (ref 70–99)
Potassium: 3.6 mEq/L (ref 3.5–5.1)
Sodium: 138 mEq/L (ref 135–145)
Total Bilirubin: 0.4 mg/dL (ref 0.3–1.2)
Total Protein: 7.8 g/dL (ref 6.0–8.3)

## 2012-09-02 LAB — URINE MICROSCOPIC-ADD ON

## 2012-09-02 LAB — URINALYSIS, ROUTINE W REFLEX MICROSCOPIC
Hgb urine dipstick: NEGATIVE
Ketones, ur: 15 mg/dL — AB
Protein, ur: NEGATIVE mg/dL
Urobilinogen, UA: 0.2 mg/dL (ref 0.0–1.0)

## 2012-09-02 LAB — WET PREP, GENITAL

## 2012-09-02 MED ORDER — HYDROCODONE-ACETAMINOPHEN 5-325 MG PO TABS: 1.0000 | ORAL_TABLET | ORAL | Status: AC | PRN

## 2012-09-02 MED ORDER — IOHEXOL 300 MG/ML  SOLN
50.0000 mL | Freq: Once | INTRAMUSCULAR | Status: AC | PRN
  Administered 2012-09-02: 25 mL via ORAL

## 2012-09-02 MED ORDER — CIPROFLOXACIN HCL 500 MG PO TABS
500.0000 mg | ORAL_TABLET | Freq: Once | ORAL | Status: AC
  Administered 2012-09-02: 500 mg via ORAL
  Filled 2012-09-02: qty 1

## 2012-09-02 MED ORDER — METRONIDAZOLE 500 MG PO TABS: 500.0000 mg | ORAL_TABLET | Freq: Two times a day (BID) | ORAL | Status: DC

## 2012-09-02 MED ORDER — HYDROCODONE-ACETAMINOPHEN 5-325 MG PO TABS
1.0000 | ORAL_TABLET | Freq: Once | ORAL | Status: AC
  Administered 2012-09-02: 1 via ORAL
  Filled 2012-09-02: qty 1

## 2012-09-02 MED ORDER — IOHEXOL 300 MG/ML  SOLN
100.0000 mL | Freq: Once | INTRAMUSCULAR | Status: AC | PRN
  Administered 2012-09-02: 100 mL via INTRAVENOUS

## 2012-09-02 MED ORDER — CIPROFLOXACIN HCL 500 MG PO TABS: 500.0000 mg | ORAL_TABLET | Freq: Two times a day (BID) | ORAL | Status: DC

## 2012-09-02 MED ORDER — SODIUM CHLORIDE 0.9 % IV SOLN
INTRAVENOUS | Status: DC
  Administered 2012-09-02: 12:00:00 via INTRAVENOUS
  Administered 2012-09-02: 160 mL/h via INTRAVENOUS

## 2012-09-02 NOTE — ED Notes (Signed)
Pt states she is unable to drink second cup of contrast, despite encouragement. Pt denies nausea, states "my stomach feels tight and there is no room for anymore in there..." Dr. Ignacia Palma alerted, ok's scan with one cup of contrast ingested.

## 2012-09-02 NOTE — ED Provider Notes (Signed)
History     CSN: [id number]  Arrival date & time 09/02/12  5284   First MD Initiated Contact with Patient 09/02/12 [phone]      Chief Complaint  Patient presents with  . Abdominal Pain    (Consider location/radiation/quality/duration/timing/severity/associated sxs/prior treatment) HPI Comments: The patient says it hurts to eat or drink. She feels lightheaded. She has been sick for couple of weeks She denies vomiting or diarrhea. There's been no painful urination. She had a hysterectomy 3 months ago. She feels pain in the lower abdomen mainly. There's been no fever though she sometimes feels hot or cold. She's had no prior evaluation for this problem.   Patient is a 41 y.o. female presenting with abdominal pain. The history is provided by the patient. No language interpreter was used.  Abdominal Pain The primary symptoms of the illness include abdominal pain. The primary symptoms of the illness do not include fever, nausea, vomiting, diarrhea, dysuria or vaginal discharge. The current episode started more than 2 days ago. The onset of the illness was gradual. The problem has not changed since onset. The illness is associated with eating. Additional symptoms associated with the illness include chills. Symptoms associated with the illness do not include hematuria.    Past Medical History  Diagnosis Date  . Kidney calculi     Past Surgical History  Procedure Date  . Abdominal hysterectomy     No family history on file.  History  Substance Use Topics  . Smoking status: Never Smoker   . Smokeless tobacco: Not on file  . Alcohol Use: No    OB History    Grav Para Term Preterm Abortions TAB SAB Ect Mult Living                  Review of Systems  Constitutional: Positive for chills. Negative for fever.  HENT: Negative.   Eyes: Negative.   Respiratory: Negative.   Cardiovascular: Negative.   Gastrointestinal: Positive for abdominal pain. Negative for nausea, vomiting and  diarrhea.  Genitourinary: Negative.  Negative for dysuria, hematuria and vaginal discharge.  Musculoskeletal: Negative.   Skin: Negative.   Neurological: Negative.   Psychiatric/Behavioral: Negative.     Allergies  Review of patient's allergies indicates no known allergies.  Home Medications  No current outpatient prescriptions on file.  BP 114/80  Pulse 60  Temp 98.3 F (36.8 C) (Oral)  Resp 16  Ht 5\' 4"  (1.626 m)  Wt 160 lb (72.576 kg)  BMI 27.46 kg/m2  SpO2 100%  LMP 04/05/2011  Physical Exam  Nursing note and vitals reviewed. Constitutional: She is oriented to person, place, and time. She appears well-developed and well-nourished.       In mild distress with lower abdominal pain.  HENT:  Head: Normocephalic and atraumatic.  Right Ear: External ear normal.  Left Ear: External ear normal.  Mouth/Throat: Oropharynx is clear and moist.  Eyes: Conjunctivae normal and EOM are normal. Pupils are equal, round, and reactive to light. No scleral icterus.  Neck: Normal range of motion. Neck supple.  Cardiovascular: Normal rate, regular rhythm and normal heart sounds.   Pulmonary/Chest: Effort normal and breath sounds normal.  Abdominal: Soft.       She localizes pain to the lower abdominal area bilaterally. She has a well-healed Pfannenstiel scar.  Musculoskeletal: Normal range of motion. She exhibits no tenderness.  Lymphadenopathy:    She has no cervical adenopathy.  Neurological: She is alert and oriented to person, place, and time.  No sensory or motor deficit.  Skin: Skin is warm and dry.  Psychiatric: She has a normal mood and affect. Her behavior is normal.    ED Course  Procedures (including critical care time)  Labs Reviewed  URINALYSIS, ROUTINE W REFLEX MICROSCOPIC - Abnormal; Notable for the following:    Ketones, ur 15 (*)     Leukocytes, UA MODERATE (*)     All other components within normal limits  URINE MICROSCOPIC-ADD ON - Abnormal; Notable for  the following:    Bacteria, UA MANY (*)     All other components within normal limits  CBC WITH DIFFERENTIAL  COMPREHENSIVE METABOLIC PANEL  URINALYSIS, ROUTINE W REFLEX MICROSCOPIC  WET PREP, GENITAL  GC/CHLAMYDIA PROBE AMP   9:12 AM Patient was seen and had physical examination. Laboratory tests and CT x-ray of the abdomen were ordered.  11:44 AM Pelvic exam performed.    Results for orders placed during the hospital encounter of 09/02/12  URINALYSIS, ROUTINE W REFLEX MICROSCOPIC      Component Value Range   Color, Urine YELLOW  YELLOW   APPearance CLEAR  CLEAR   Specific Gravity, Urine 1.022  1.005 - 1.030   pH 5.0  5.0 - 8.0   Glucose, UA NEGATIVE  NEGATIVE mg/dL   Hgb urine dipstick NEGATIVE  NEGATIVE   Bilirubin Urine NEGATIVE  NEGATIVE   Ketones, ur 15 (*) NEGATIVE mg/dL   Protein, ur NEGATIVE  NEGATIVE mg/dL   Urobilinogen, UA 0.2  0.0 - 1.0 mg/dL   Nitrite NEGATIVE  NEGATIVE   Leukocytes, UA MODERATE (*) NEGATIVE  URINE MICROSCOPIC-ADD ON      Component Value Range   Squamous Epithelial / LPF RARE  RARE   WBC, UA 21-50  <3 WBC/hpf   RBC / HPF 0-2  <3 RBC/hpf   Bacteria, UA MANY (*) RARE  CBC WITH DIFFERENTIAL      Component Value Range   WBC 2.9 (*) 4.0 - 10.5 K/uL   RBC 4.28  3.87 - 5.11 MIL/uL   Hemoglobin 11.8 (*) 12.0 - 15.0 g/dL   HCT 16.1 (*) 09.6 - 04.5 %   MCV 81.3  78.0 - 100.0 fL   MCH 27.6  26.0 - 34.0 pg   MCHC 33.9  30.0 - 36.0 g/dL   RDW 40.9  81.1 - 91.4 %   Platelets 262  150 - 400 K/uL   Neutrophils Relative 47  43 - 77 %   Lymphocytes Relative 39  12 - 46 %   Monocytes Relative 13 (*) 3 - 12 %   Eosinophils Relative 1  0 - 5 %   Basophils Relative 0  0 - 1 %   Neutro Abs 1.4 (*) 1.7 - 7.7 K/uL   Lymphs Abs 1.1  0.7 - 4.0 K/uL   Monocytes Absolute 0.4  0.1 - 1.0 K/uL   Eosinophils Absolute 0.0  0.0 - 0.7 K/uL   Basophils Absolute 0.0  0.0 - 0.1 K/uL   Smear Review MORPHOLOGY UNREMARKABLE    COMPREHENSIVE METABOLIC PANEL       Component Value Range   Sodium 138  135 - 145 mEq/L   Potassium 3.6  3.5 - 5.1 mEq/L   Chloride 104  96 - 112 mEq/L   CO2 24  19 - 32 mEq/L   Glucose, Bld 84  70 - 99 mg/dL   BUN 9  6 - 23 mg/dL   Creatinine, Ser 7.82  0.50 - 1.10 mg/dL   Calcium  8.9  8.4 - 10.5 mg/dL   Total Protein 7.8  6.0 - 8.3 g/dL   Albumin 3.8  3.5 - 5.2 g/dL   AST 13  0 - 37 U/L   ALT 8  0 - 35 U/L   Alkaline Phosphatase 44  39 - 117 U/L   Total Bilirubin 0.4  0.3 - 1.2 mg/dL   GFR calc non Af Amer >90  >90 mL/min   GFR calc Af Amer >90  >90 mL/min   Ct Abdomen Pelvis W Contrast  09/02/2012  *RADIOLOGY REPORT*  Clinical Data: Lower abdominal pain  CT ABDOMEN AND PELVIS WITH CONTRAST  Technique:  Multidetector CT imaging of the abdomen and pelvis was performed following the standard protocol during bolus administration of intravenous contrast.  Contrast: OMNIPAQUE IOHEXOL 300 MG/ML  SOLN, 25mL OMNIPAQUE IOHEXOL 300 MG/ML  SOLN  Comparison: 04/09/2011  Findings:  The lung bases are clear.  No pleural or pericardial effusion.  There are no liver abnormalities identified.  The gallbladder is within normal limits.  No biliary dilatation.  Normal appearance of the pancreas.  The spleen is normal.  Bilateral adrenal glands are within normal limits. Normal appearance of the kidneys.  No nephrolithiasis or hydronephrosis. Urinary bladder is within normal limits.  There is a cyst identified within the left ovary measuring 1.7 cm. Previous hysterectomy.  A small amount of fluid is noted within the dependent portion of the pelvis measuring 13 HU.  No enlarged upper abdominal lymph nodes.  Upper limits of normal right external iliac node measures 11 mm, image 69.  Left external iliac node measures 1 cm.  The stomach and the small bowel loops have a normal caliber.  The appendix is visualized and appears normal.  Normal appearance of the colon.  Review of the visualized bony structures is unremarkable.  IMPRESSION:  1.  Small  amount of free fluid within the dependent portion of the pelvis is nonspecific.  This may be physiologic in a premenopausal female. 2.  Small left ovarian cyst measures 1.7 cm. 3.  The appendix is visualized and appears normal.   Original Report Authenticated By: Signa Kell, M.D.     11:44 AM Lab tests reviewed with pt.  She has a UTI and a small left ovarian cyst.  Will treat with Cipro, hydrocodone-acetaminophen.  12:57 PM Wet prep shows few clue cells. Will Rx for UTI with Cipro, BV with metronidazole, pain with hydrocodone-acetaminophen.   1. Urinary tract infection   2. Bacterial vaginosis       Carleene Cooper III, MD 09/02/12 [phone]

## 2012-09-02 NOTE — ED Notes (Signed)
Patient transported to CT 

## 2012-09-02 NOTE — ED Notes (Signed)
Pt c/o lower abdominal pain and "vomiting after eating" x 2 wks. Pt reports h/o hysterectomy 3 months ago. No vomiting or distress noted in triage.

## 2012-09-04 LAB — URINE CULTURE

## 2013-07-13 ENCOUNTER — Encounter (HOSPITAL_BASED_OUTPATIENT_CLINIC_OR_DEPARTMENT_OTHER): Payer: Self-pay | Admitting: Emergency Medicine

## 2013-07-13 ENCOUNTER — Emergency Department (HOSPITAL_BASED_OUTPATIENT_CLINIC_OR_DEPARTMENT_OTHER): Payer: Medicaid Other

## 2013-07-13 ENCOUNTER — Emergency Department (HOSPITAL_BASED_OUTPATIENT_CLINIC_OR_DEPARTMENT_OTHER)
Admission: EM | Admit: 2013-07-13 | Discharge: 2013-07-13 | Disposition: A | Discharge status: Home / Self Care | Payer: Medicaid Other | Attending: Emergency Medicine | Admitting: Emergency Medicine

## 2013-07-13 DIAGNOSIS — K5909 Other constipation: Secondary | ICD-10-CM

## 2013-07-13 DIAGNOSIS — N83202 Unspecified ovarian cyst, left side: Secondary | ICD-10-CM

## 2013-07-13 DIAGNOSIS — Z90710 Acquired absence of both cervix and uterus: Secondary | ICD-10-CM | POA: Insufficient documentation

## 2013-07-13 DIAGNOSIS — K5900 Constipation, unspecified: Secondary | ICD-10-CM | POA: Insufficient documentation

## 2013-07-13 DIAGNOSIS — R110 Nausea: Secondary | ICD-10-CM | POA: Insufficient documentation

## 2013-07-13 DIAGNOSIS — B9689 Other specified bacterial agents as the cause of diseases classified elsewhere: Secondary | ICD-10-CM

## 2013-07-13 DIAGNOSIS — Z87442 Personal history of urinary calculi: Secondary | ICD-10-CM | POA: Insufficient documentation

## 2013-07-13 DIAGNOSIS — N83209 Unspecified ovarian cyst, unspecified side: Secondary | ICD-10-CM | POA: Insufficient documentation

## 2013-07-13 DIAGNOSIS — Z792 Long term (current) use of antibiotics: Secondary | ICD-10-CM | POA: Insufficient documentation

## 2013-07-13 DIAGNOSIS — Z79899 Other long term (current) drug therapy: Secondary | ICD-10-CM | POA: Insufficient documentation

## 2013-07-13 DIAGNOSIS — Z791 Long term (current) use of non-steroidal anti-inflammatories (NSAID): Secondary | ICD-10-CM | POA: Insufficient documentation

## 2013-07-13 DIAGNOSIS — N760 Acute vaginitis: Secondary | ICD-10-CM | POA: Insufficient documentation

## 2013-07-13 LAB — BASIC METABOLIC PANEL
BUN: 11 mg/dL (ref 6–23)
Calcium: 9.1 mg/dL (ref 8.4–10.5)
Creatinine, Ser: 0.7 mg/dL (ref 0.50–1.10)
GFR calc Af Amer: >90 mL/min (ref >90)
GFR calc non Af Amer: >90 mL/min (ref >90)
Potassium: 3.2 mEq/L — ABNORMAL LOW (ref 3.5–5.1)

## 2013-07-13 LAB — URINALYSIS, ROUTINE W REFLEX MICROSCOPIC
Glucose, UA: NEGATIVE mg/dL
Hgb urine dipstick: NEGATIVE
Protein, ur: NEGATIVE mg/dL

## 2013-07-13 LAB — WET PREP, GENITAL
Trich, Wet Prep: NONE SEEN
Yeast Wet Prep HPF POC: NONE SEEN

## 2013-07-13 MED ORDER — IBUPROFEN 800 MG PO TABS: 800.0000 mg | ORAL_TABLET | Freq: Three times a day (TID) | ORAL | Status: DC

## 2013-07-13 MED ORDER — IOHEXOL 300 MG/ML  SOLN
50.0000 mL | Freq: Once | INTRAMUSCULAR | Status: AC | PRN
  Administered 2013-07-13: 50 mL via ORAL

## 2013-07-13 MED ORDER — KETOROLAC TROMETHAMINE 30 MG/ML IJ SOLN
30.0000 mg | Freq: Once | INTRAMUSCULAR | Status: AC
  Administered 2013-07-13: 30 mg via INTRAVENOUS
  Filled 2013-07-13: qty 1

## 2013-07-13 MED ORDER — IOHEXOL 300 MG/ML  SOLN
100.0000 mL | Freq: Once | INTRAMUSCULAR | Status: AC | PRN
  Administered 2013-07-13: 100 mL via INTRAVENOUS

## 2013-07-13 MED ORDER — METRONIDAZOLE 500 MG PO TABS: 500.0000 mg | ORAL_TABLET | Freq: Two times a day (BID) | ORAL | Status: DC

## 2013-07-13 MED ORDER — POLYETHYLENE GLYCOL 3350 17 GM/SCOOP PO POWD: 17.0000 g | Freq: Two times a day (BID) | ORAL | Status: DC

## 2013-07-13 MED ORDER — POTASSIUM CHLORIDE CRYS ER 20 MEQ PO TBCR
40.0000 meq | EXTENDED_RELEASE_TABLET | Freq: Once | ORAL | Status: AC
  Administered 2013-07-13: 40 meq via ORAL
  Filled 2013-07-13: qty 2

## 2013-07-13 NOTE — ED Provider Notes (Signed)
CSN: [id number]     Arrival date & time 07/13/13  1616 History   First MD Initiated Contact with Patient 07/13/13 1627     Chief Complaint  Patient presents with  . Abdominal Pain   (Consider location/radiation/quality/duration/timing/severity/associated sxs/prior Treatment) The history is provided by the patient and medical records. No language interpreter was used.    Christina Holmes is a 42 y.o. female  with a hx of kidney calculi (2013), SAB (2012), hysterectomy (2013), left ovarian cyst presents to the Emergency Department complaining of gradual, waxing and waning, progressively worsening LLQ abd pain onset 1 week ago.  Pt reports her pain is located in the LLQ and described as sharp with constant associated pressure.  She reports her pain became acutely worse last night and into today.  Pt also reports associated chronic constipation (using an enema every other day to produce hard, small balls of fecal matter with significant straining), dysuria, vaginal discharge and vaginal odor. Pt reports one female sexual partner for several years and distant hx of STD which was treated.  Use of the enema with production of BM makes the pressure better temporarily.  Advil and tylenol make the sharp pain some better.  Pt denies fever, chills, headache, neck pain, chest pain, vomiting, diarrhea, weakness, dizziness, syncope, hematuria.      Past Medical History  Diagnosis Date  . Kidney calculi    Past Surgical History  Procedure Laterality Date  . Abdominal hysterectomy     No family history on file. History  Substance Use Topics  . Smoking status: Never Smoker   . Smokeless tobacco: Not on file  . Alcohol Use: No   OB History   Grav Para Term Preterm Abortions TAB SAB Ect Mult Living                 Review of Systems  Constitutional: Negative for fever, diaphoresis, appetite change, fatigue and unexpected weight change.  HENT: Negative for mouth sores and trouble swallowing.     Respiratory: Negative for cough, chest tightness, shortness of breath, wheezing and stridor.   Cardiovascular: Negative for chest pain and palpitations.  Gastrointestinal: Positive for nausea (mild, intermittent) and abdominal pain. Negative for vomiting, diarrhea, constipation, blood in stool, abdominal distention and rectal pain.  Genitourinary: Positive for dysuria, vaginal discharge and vaginal pain. Negative for urgency, frequency, hematuria, flank pain and difficulty urinating.  Musculoskeletal: Negative for back pain, neck pain and neck stiffness.  Skin: Negative for rash.  Neurological: Negative for weakness.  Hematological: Negative for adenopathy.  Psychiatric/Behavioral: Negative for confusion.  All other systems reviewed and are negative.    Allergies  Review of patient's allergies indicates no known allergies.  Home Medications   Current Outpatient Rx  Name  Route  Sig  Dispense  Refill  . ciprofloxacin (CIPRO) 500 MG tablet   Oral   Take 1 tablet (500 mg total) by mouth 2 (two) times daily.   14 tablet   0   . ibuprofen (ADVIL,MOTRIN) 800 MG tablet   Oral   Take 1 tablet (800 mg total) by mouth 3 (three) times daily.   21 tablet   0   . metroNIDAZOLE (FLAGYL) 500 MG tablet   Oral   Take 1 tablet (500 mg total) by mouth 2 (two) times daily.   14 tablet   0   . metroNIDAZOLE (FLAGYL) 500 MG tablet   Oral   Take 1 tablet (500 mg total) by mouth 2 (two) times daily.  One po bid x 7 days   14 tablet   0   . polyethylene glycol powder (GLYCOLAX/MIRALAX) powder   Oral   Take 17 g by mouth 2 (two) times daily. Until daily soft stools  OTC   255 g   0    BP 123/71  Pulse 58  Temp(Src) 98.4 F (36.9 C) (Oral)  Resp 18  Ht 5\' 4"  (1.626 m)  Wt 180 lb (81.647 kg)  BMI 30.88 kg/m2  SpO2 100%  LMP 04/05/2011 Physical Exam  Nursing note and vitals reviewed. Constitutional: She appears well-developed and well-nourished. No distress.  Awake, alert,  nontoxic appearance  HENT:  Head: Normocephalic and atraumatic.  Mouth/Throat: Oropharynx is clear and moist. No oropharyngeal exudate.  Eyes: Conjunctivae are normal. No scleral icterus.  Neck: Normal range of motion. Neck supple.  Cardiovascular: Normal rate, regular rhythm, normal heart sounds and intact distal pulses.   No murmur heard. Pulmonary/Chest: Effort normal and breath sounds normal. No respiratory distress. She has no wheezes.  Abdominal: Soft. Normal appearance and bowel sounds are normal. She exhibits no distension and no mass. There is tenderness. There is guarding. There is no rigidity, no rebound and no CVA tenderness. Hernia confirmed negative in the right inguinal area and confirmed negative in the left inguinal area.    Genitourinary: No labial fusion. There is no rash, tenderness, lesion or injury on the right labia. There is no rash, tenderness, lesion or injury on the left labia. Right adnexum displays no mass, no tenderness and no fullness. Left adnexum displays tenderness. Left adnexum displays no mass and no fullness. No erythema, tenderness or bleeding around the vagina. No foreign body around the vagina. No signs of injury around the vagina. Vaginal discharge (thick, white, modeate amount) found.  Surgically absent cervix  Musculoskeletal: Normal range of motion. She exhibits no edema.  Lymphadenopathy:    She has no cervical adenopathy.       Right: No inguinal adenopathy present.       Left: No inguinal adenopathy present.  Neurological: She is alert. She exhibits normal muscle tone. Coordination normal.  Speech is clear and goal oriented Moves extremities without ataxia  Skin: Skin is warm and dry. She is not diaphoretic.  Psychiatric: She has a normal mood and affect.    ED Course  Procedures (including critical care time) Labs Review Labs Reviewed  WET PREP, GENITAL - Abnormal; Notable for the following:    Clue Cells Wet Prep HPF POC MODERATE (*)     WBC, Wet Prep HPF POC FEW (*)    All other components within normal limits  BASIC METABOLIC PANEL - Abnormal; Notable for the following:    Potassium 3.2 (*)    All other components within normal limits  GC/CHLAMYDIA PROBE AMP  URINALYSIS, ROUTINE W REFLEX MICROSCOPIC   Imaging Review US Transvaginal Non-ob  07/13/2013   CLINICAL DATA:  Left pelvic pain.  Prior hysterectomy.  EXAM: TRANSABDOMINAL AND TRANSVAGINAL ULTRASOUND OF PELVIS  TECHNIQUE: Both transabdominal and transvaginal ultrasound examinations of the pelvis were performed. Transabdominal technique was performed for global imaging of the pelvis including uterus, ovaries, adnexal regions, and pelvic cul-de-sac. It was necessary to proceed with endovaginal exam following the transabdominal exam to visualize the vaginal cuff and ovaries.  COMPARISON:  None  FINDINGS: Uterus  Measurements: Surgically absent. Vaginal cuff is unremarkable in appearance.  Endometrium  Thickness: Not applicable.  Right ovary  Measurements: Not directly visualized by transabdominal or transvaginal sonography, however  no adnexal mass identified.  Left ovary  Measurements: Not directly visualized by transabdominal or transvaginal sonography, however no adnexal mass identified.  Other findings  Trace amount of free fluid noted.  IMPRESSION: Prior hysterectomy. Nonvisualization of ovaries, however no adnexal mass identified.   Electronically Signed   By: Myles Rosenthal M.D.   On: 07/13/2013 17:42   US Pelvis Complete  07/13/2013   CLINICAL DATA:  Left pelvic pain.  Prior hysterectomy.  EXAM: TRANSABDOMINAL AND TRANSVAGINAL ULTRASOUND OF PELVIS  TECHNIQUE: Both transabdominal and transvaginal ultrasound examinations of the pelvis were performed. Transabdominal technique was performed for global imaging of the pelvis including uterus, ovaries, adnexal regions, and pelvic cul-de-sac. It was necessary to proceed with endovaginal exam following the transabdominal exam to  visualize the vaginal cuff and ovaries.  COMPARISON:  None  FINDINGS: Uterus  Measurements: Surgically absent. Vaginal cuff is unremarkable in appearance.  Endometrium  Thickness: Not applicable.  Right ovary  Measurements: Not directly visualized by transabdominal or transvaginal sonography, however no adnexal mass identified.  Left ovary  Measurements: Not directly visualized by transabdominal or transvaginal sonography, however no adnexal mass identified.  Other findings  Trace amount of free fluid noted.  IMPRESSION: Prior hysterectomy. Nonvisualization of ovaries, however no adnexal mass identified.   Electronically Signed   By: Myles Rosenthal M.D.   On: 07/13/2013 17:42   Ct Abdomen Pelvis W Contrast  07/13/2013   CLINICAL DATA:  Left lower quadrant pain  EXAM: CT ABDOMEN AND PELVIS WITH CONTRAST  TECHNIQUE: Multidetector CT imaging of the abdomen and pelvis was performed using the standard protocol following bolus administration of intravenous contrast.  CONTRAST:  50mL OMNIPAQUE IOHEXOL 300 MG/ML SOLN, OMNIPAQUE IOHEXOL 300 MG/ML SOLN  COMPARISON:  09/02/2012 and 04/07/2011  FINDINGS: No focal abnormality is seen in the liver or spleen. The stomach, duodenum, pancreas, gallbladder, and adrenal glands are unremarkable. No focal abnormality is seen in either kidney.  No abdominal aortic aneurysm. There is no free fluid or lymphadenopathy in the abdomen.  Imaging through the pelvis shows trace free intraperitoneal fluid. Inguinal and common femoral lymph nodes bilaterally are unchanged, measuring at upper normal. 11 mm right pelvic sidewall lymph node is stable. The 10 mm left external iliac lymph node measured previously is unchanged.  The uterus is surgically absent. No evidence for adnexal mass. 2.5 cm dominant follicle noted in the left ovary. No colonic diverticulitis. The terminal ileum is normal. The appendix is normal.  Bone windows reveal no worrisome lytic or sclerotic osseous lesions.   IMPRESSION: Trace intraperitoneal free fluid is nonspecific in a premenopausal female.  No substantial change in the borderline bilateral pelvic sidewall lymphadenopathy comparing back to 09/02/2012. It does remain more prominent than it was on the 04/07/2011 exam.  Dominant follicle in the left ovary.   Electronically Signed   By: Kennith Center M.D.   On: 07/13/2013 19:25   Dg Abd Acute W/chest  07/13/2013   CLINICAL DATA:  Left lower quadrant pain for 1 week.  EXAM: ACUTE ABDOMEN SERIES (ABDOMEN 2 VIEW & CHEST 1 VIEW)  COMPARISON:  09/02/2012  FINDINGS: There is no evidence of dilated bowel loops or free intraperitoneal air. No radiopaque calculi or other significant radiographic abnormality is seen. Heart size and mediastinal contours are within normal limits. Both lungs are clear.  IMPRESSION: Negative abdominal radiographs.  No acute cardiopulmonary disease.   Electronically Signed   By: Signa Kell M.D.   On: 07/13/2013 17:26  EKG Interpretation   None       MDM   1. BV (bacterial vaginosis)   2. Left ovarian cyst   3. Constipation, chronic      Christina Holmes presents with LLQ sharp pain and pressure with associated dysuria, vaginal discharge and vaginal odor .  UA without evidence of UTI.  UA without hematuria, no CVA tenderness and no flank pain to suggest renal colic or calculi.  Pt is alert, oriented, nontoxic, nonseptic appearing in NAD.   I have offered pain control and the patient has declined.  Will obtain acute abd to assess for stool burden and SBO. Will also obtain Pelvic US as she is tender in the left adnexa on exam.    6:03 PM Ovaries not visualized on pelvic ultrasound. Will obtain CT scan. Wet prep with moderate clue cells, will treat for BV with metronidazole.  Mild hypokalemia replaced here in the department.  8:22 PM CT scan with enlarging left ovarian cyst. No evidence of diverticulitis, appendicitis.  I personally reviewed the imaging tests through PACS  system.  I reviewed available ER/hospitalization records through the EMR.  Pain completely resolved with Toradol.  Patient is nontoxic, nonseptic appearing, in no apparent distress.  Patient's pain and other symptoms adequately managed in emergency department.  Fluid bolus given.  Labs, imaging and vitals reviewed.  Patient does not meet the SIRS or Sepsis criteria.  On repeat exam patient does not have a surgical abdomin and there are no peritoneal signs.  No indication of appendicitis, bowel obstruction, bowel perforation, cholecystitis, diverticulitis.  Patient discharged home with symptomatic treatment and given strict instructions for follow-up with their OBGYN.    It has been determined that no acute conditions requiring further emergency intervention are present at this time. The patient/guardian have been advised of the diagnosis and plan. We have discussed signs and symptoms that warrant return to the ED, such as changes or worsening in symptoms.   Vital signs are stable at discharge.   BP 123/71  Pulse 58  Temp(Src) 98.4 F (36.9 C) (Oral)  Resp 18  Ht 5\' 4"  (1.626 m)  Wt 180 lb (81.647 kg)  BMI 30.88 kg/m2  SpO2 100%  LMP 04/05/2011  Patient/guardian has voiced understanding and agreed to follow-up with the PCP or specialist.          Dierdre Forth, PA-C 07/14/13 0030  Dahlia Client Zenna Traister, PA-C 07/14/13 0030

## 2013-07-13 NOTE — ED Notes (Signed)
Patient transported to CT 

## 2013-07-13 NOTE — ED Notes (Signed)
Pt c/o LLQ pain x1 week. Pt denies N/V/D. Pt c/o dysuria, odor, and discharge.

## 2013-07-13 NOTE — ED Notes (Signed)
PA at bedside.

## 2013-07-14 LAB — GC/CHLAMYDIA PROBE AMP: CT Probe RNA: NEGATIVE

## 2013-07-14 NOTE — ED Provider Notes (Signed)
Medical screening examination/treatment/procedure(s) were performed by non-physician practitioner and as supervising physician I was immediately available for consultation/collaboration.   Junius Argyle, MD 07/14/13 [phone]

## 2013-12-20 ENCOUNTER — Emergency Department (HOSPITAL_BASED_OUTPATIENT_CLINIC_OR_DEPARTMENT_OTHER)
Admission: EM | Admit: 2013-12-20 | Discharge: 2013-12-20 | Disposition: A | Discharge status: Home / Self Care | Payer: Medicaid Other | Attending: Emergency Medicine | Admitting: Emergency Medicine

## 2013-12-20 ENCOUNTER — Encounter (HOSPITAL_BASED_OUTPATIENT_CLINIC_OR_DEPARTMENT_OTHER): Payer: Self-pay | Admitting: Emergency Medicine

## 2013-12-20 DIAGNOSIS — N760 Acute vaginitis: Secondary | ICD-10-CM | POA: Insufficient documentation

## 2013-12-20 DIAGNOSIS — B9689 Other specified bacterial agents as the cause of diseases classified elsewhere: Secondary | ICD-10-CM | POA: Insufficient documentation

## 2013-12-20 DIAGNOSIS — A499 Bacterial infection, unspecified: Secondary | ICD-10-CM | POA: Insufficient documentation

## 2013-12-20 DIAGNOSIS — Z87442 Personal history of urinary calculi: Secondary | ICD-10-CM | POA: Insufficient documentation

## 2013-12-20 DIAGNOSIS — Z792 Long term (current) use of antibiotics: Secondary | ICD-10-CM | POA: Insufficient documentation

## 2013-12-20 DIAGNOSIS — R112 Nausea with vomiting, unspecified: Secondary | ICD-10-CM | POA: Insufficient documentation

## 2013-12-20 DIAGNOSIS — R109 Unspecified abdominal pain: Secondary | ICD-10-CM

## 2013-12-20 LAB — COMPREHENSIVE METABOLIC PANEL
ALT: 11 U/L (ref 0–35)
AST: 18 U/L (ref 0–37)
Albumin: 4.2 g/dL (ref 3.5–5.2)
Alkaline Phosphatase: 45 U/L (ref 39–117)
BUN: 9 mg/dL (ref 6–23)
CALCIUM: 9.3 mg/dL (ref 8.4–10.5)
CO2: 24 meq/L (ref 19–32)
CREATININE: 0.7 mg/dL (ref 0.50–1.10)
Chloride: 103 mEq/L (ref 96–112)
GFR calc non Af Amer: >90 mL/min (ref >90)
GLUCOSE: 86 mg/dL (ref 70–99)
Potassium: 3.7 mEq/L (ref 3.7–5.3)
Sodium: 141 mEq/L (ref 137–147)
Total Bilirubin: 0.5 mg/dL (ref 0.3–1.2)
Total Protein: 8.5 g/dL — ABNORMAL HIGH (ref 6.0–8.3)

## 2013-12-20 LAB — URINALYSIS, ROUTINE W REFLEX MICROSCOPIC
GLUCOSE, UA: NEGATIVE mg/dL
HGB URINE DIPSTICK: NEGATIVE
Ketones, ur: NEGATIVE mg/dL
Nitrite: NEGATIVE
PH: 5 (ref 5.0–8.0)
Protein, ur: NEGATIVE mg/dL
SPECIFIC GRAVITY, URINE: 1.031 — AB (ref 1.005–1.030)
Urobilinogen, UA: 0.2 mg/dL (ref 0.0–1.0)

## 2013-12-20 LAB — CBC
HCT: 38.4 % (ref 36.0–46.0)
HEMOGLOBIN: 13 g/dL (ref 12.0–15.0)
MCH: 28.7 pg (ref 26.0–34.0)
MCHC: 33.9 g/dL (ref 30.0–36.0)
MCV: 84.8 fL (ref 78.0–100.0)
Platelets: 216 10*3/uL (ref 150–400)
RBC: 4.53 MIL/uL (ref 3.87–5.11)
RDW: 13.3 % (ref 11.5–15.5)
WBC: 3.3 10*3/uL — ABNORMAL LOW (ref 4.0–10.5)

## 2013-12-20 LAB — URINE MICROSCOPIC-ADD ON

## 2013-12-20 LAB — WET PREP, GENITAL
Trich, Wet Prep: NONE SEEN
Yeast Wet Prep HPF POC: NONE SEEN

## 2013-12-20 LAB — LIPASE, BLOOD: Lipase: 31 U/L (ref 11–59)

## 2013-12-20 MED ORDER — METRONIDAZOLE 500 MG PO TABS: 500.0000 mg | ORAL_TABLET | Freq: Two times a day (BID) | ORAL | Status: DC

## 2013-12-20 MED ORDER — ONDANSETRON HCL 4 MG PO TABS: 4.0000 mg | ORAL_TABLET | Freq: Four times a day (QID) | ORAL | Status: DC | PRN

## 2013-12-20 MED ORDER — ONDANSETRON 4 MG PO TBDP
4.0000 mg | ORAL_TABLET | Freq: Once | ORAL | Status: AC
  Administered 2013-12-20: 4 mg via ORAL
  Filled 2013-12-20: qty 1

## 2013-12-20 MED ORDER — HYDROCODONE-ACETAMINOPHEN 5-325 MG PO TABS
1.0000 | ORAL_TABLET | Freq: Once | ORAL | Status: AC
  Administered 2013-12-20: 1 via ORAL
  Filled 2013-12-20: qty 1

## 2013-12-20 MED ORDER — HYDROCODONE-ACETAMINOPHEN 5-325 MG PO TABS: 1.0000 | ORAL_TABLET | ORAL | Status: DC | PRN

## 2013-12-20 MED ORDER — IBUPROFEN 800 MG PO TABS: 800.0000 mg | ORAL_TABLET | Freq: Three times a day (TID) | ORAL | Status: DC | PRN

## 2013-12-20 NOTE — ED Notes (Signed)
Pt reports constant nausea with occasional emesis that is worsen when around strong smells. Admit to intermittant lightheadness and body chills denied pregnancy has had hysterectomy

## 2013-12-20 NOTE — Discharge Instructions (Signed)
Abdominal Pain, Women °Abdominal (stomach, pelvic, or belly) pain can be caused by many things. It is important to tell your doctor: °· The location of the pain. °· Does it come and go or is it present all the time? °· Are there things that start the pain (eating certain foods, exercise)? °· Are there other symptoms associated with the pain (fever, nausea, vomiting, diarrhea)? °All of this is helpful to know when trying to find the cause of the pain. °CAUSES  °· Stomach: virus or bacteria infection, or ulcer. °· Intestine: appendicitis (inflamed appendix), regional ileitis (Crohn's disease), ulcerative colitis (inflamed colon), irritable bowel syndrome, diverticulitis (inflamed diverticulum of the colon), or cancer of the stomach or intestine. °· Gallbladder disease or stones in the gallbladder. °· Kidney disease, kidney stones, or infection. °· Pancreas infection or cancer. °· Fibromyalgia (pain disorder). °· Diseases of the female organs: °· Uterus: fibroid (non-cancerous) tumors or infection. °· Fallopian tubes: infection or tubal pregnancy. °· Ovary: cysts or tumors. °· Pelvic adhesions (scar tissue). °· Endometriosis (uterus lining tissue growing in the pelvis and on the pelvic organs). °· Pelvic congestion syndrome (female organs filling up with blood just before the menstrual period). °· Pain with the menstrual period. °· Pain with ovulation (producing an egg). °· Pain with an IUD (intrauterine device, birth control) in the uterus. °· Cancer of the female organs. °· Functional pain (pain not caused by a disease, may improve without treatment). °· Psychological pain. °· Depression. °DIAGNOSIS  °Your doctor will decide the seriousness of your pain by doing an examination. °· Blood tests. °· X-rays. °· Ultrasound. °· CT scan (computed tomography, special type of X-ray). °· MRI (magnetic resonance imaging). °· Cultures, for infection. °· Barium enema (dye inserted in the large intestine, to better view it with  X-rays). °· Colonoscopy (looking in intestine with a lighted tube). °· Laparoscopy (minor surgery, looking in abdomen with a lighted tube). °· Major abdominal exploratory surgery (looking in abdomen with a large incision). °TREATMENT  °The treatment will depend on the cause of the pain.  °· Many cases can be observed and treated at home. °· Over-the-counter medicines recommended by your caregiver. °· Prescription medicine. °· Antibiotics, for infection. °· Birth control pills, for painful periods or for ovulation pain. °· Hormone treatment, for endometriosis. °· Nerve blocking injections. °· Physical therapy. °· Antidepressants. °· Counseling with a psychologist or psychiatrist. °· Minor or major surgery. °HOME CARE INSTRUCTIONS  °· Do not take laxatives, unless directed by your caregiver. °· Take over-the-counter pain medicine only if ordered by your caregiver. Do not take aspirin because it can cause an upset stomach or bleeding. °· Try a clear liquid diet (broth or water) as ordered by your caregiver. Slowly move to a bland diet, as tolerated, if the pain is related to the stomach or intestine. °· Have a thermometer and take your temperature several times a day, and record it. °· Bed rest and sleep, if it helps the pain. °· Avoid sexual intercourse, if it causes pain. °· Avoid stressful situations. °· Keep your follow-up appointments and tests, as your caregiver orders. °· If the pain does not go away with medicine or surgery, you may try: °· Acupuncture. °· Relaxation exercises (yoga, meditation). °· Group therapy. °· Counseling. °SEEK MEDICAL CARE IF:  °· You notice certain foods cause stomach pain. °· Your home care treatment is not helping your pain. °· You need stronger pain medicine. °· You want your IUD removed. °· You feel faint or   lightheaded. °· You develop nausea and vomiting. °· You develop a rash. °· You are having side effects or an allergy to your medicine. °SEEK IMMEDIATE MEDICAL CARE IF:  °· Your  pain does not go away or gets worse. °· You have a fever. °· Your pain is felt only in portions of the abdomen. The right side could possibly be appendicitis. The left lower portion of the abdomen could be colitis or diverticulitis. °· You are passing blood in your stools (bright red or black tarry stools, with or without vomiting). °· You have blood in your urine. °· You develop chills, with or without a fever. °· You pass out. °MAKE SURE YOU:  °· Understand these instructions. °· Will watch your condition. °· Will get help right away if you are not doing well or get worse. °Document Released: 07/08/2007 Document Revised: 12/03/2011 Document Reviewed: 07/28/2009 °[employer]® Patient Information ©2014 [employer]. ° °

## 2013-12-20 NOTE — ED Provider Notes (Signed)
CSN: [id number]     Arrival date & time 12/20/13  5681 History   First MD Initiated Contact with Patient 12/20/13 0700     Chief Complaint  Patient presents with  . Abdominal Pain     (Consider location/radiation/quality/duration/timing/severity/associated sxs/prior Treatment) HPI 43 year old female presents with 2 weeks of constant left lower quadrant abdominal pain. She states it does feel like prior problems with her ovarian cyst. She describes the pain as severe. His been constant but does seem to sometimes get worse with eating. She states that she has not tried anything for the pain, she just been laying around. She feels nauseous with eating but yesterday vomited after eating chicken. She states that she's not had any fevers or chills. No radiation of the pain. No urinary or vaginal symptoms. She came to the ER last night but it was too crowded so she returned this morning.  Past Medical History  Diagnosis Date  . Kidney calculi    Past Surgical History  Procedure Laterality Date  . Abdominal hysterectomy     History reviewed. No pertinent family history. History  Substance Use Topics  . Smoking status: Never Smoker   . Smokeless tobacco: Not on file  . Alcohol Use: No   OB History   Grav Para Term Preterm Abortions TAB SAB Ect Mult Living                 Review of Systems  Constitutional: Negative for fever.  Gastrointestinal: Positive for nausea, vomiting and abdominal pain. Negative for diarrhea and constipation.  Genitourinary: Negative for dysuria, vaginal bleeding, vaginal discharge and menstrual problem.  Musculoskeletal: Negative for back pain.  All other systems reviewed and are negative.      Allergies  Review of patient's allergies indicates no known allergies.  Home Medications   Current Outpatient Rx  Name  Route  Sig  Dispense  Refill  . ciprofloxacin (CIPRO) 500 MG tablet   Oral   Take 1 tablet (500 mg total) by mouth 2 (two) times daily.  14 tablet   0   . ibuprofen (ADVIL,MOTRIN) 800 MG tablet   Oral   Take 1 tablet (800 mg total) by mouth 3 (three) times daily.   21 tablet   0   . metroNIDAZOLE (FLAGYL) 500 MG tablet   Oral   Take 1 tablet (500 mg total) by mouth 2 (two) times daily.   14 tablet   0   . metroNIDAZOLE (FLAGYL) 500 MG tablet   Oral   Take 1 tablet (500 mg total) by mouth 2 (two) times daily. One po bid x 7 days   14 tablet   0   . polyethylene glycol powder (GLYCOLAX/MIRALAX) powder   Oral   Take 17 g by mouth 2 (two) times daily. Until daily soft stools  OTC   255 g   0    BP 109/73  Pulse 68  Temp(Src) 98.5 F (36.9 C) (Oral)  Resp 16  Ht 5\' 4"  (1.626 m)  Wt 150 lb (68.04 kg)  BMI 25.73 kg/m2  SpO2 100%  LMP 04/05/2011 Physical Exam  Nursing note and vitals reviewed. Constitutional: She is oriented to person, place, and time. She appears well-developed and well-nourished. No distress.  HENT:  Head: Normocephalic and atraumatic.  Right Ear: External ear normal.  Left Ear: External ear normal.  Nose: Nose normal.  Eyes: Right eye exhibits no discharge. Left eye exhibits no discharge.  Cardiovascular: Normal rate, regular rhythm and normal  heart sounds.   Pulmonary/Chest: Effort normal and breath sounds normal.  Abdominal: Soft. There is tenderness in the left upper quadrant and left lower quadrant. There is no rigidity, no rebound and no guarding.  Mild tenderness to left upper quadrant. Moderate tenderness to left lower quadrant.  Genitourinary: Vaginal discharge (white) found.  No cervix. Moderate LLQ tenderness but no palpable ovary or mass  Neurological: She is alert and oriented to person, place, and time.  Skin: Skin is warm and dry.    ED Course  Procedures (including critical care time) Labs Review Labs Reviewed  WET PREP, GENITAL - Abnormal; Notable for the following:    Clue Cells Wet Prep HPF POC MANY (*)    WBC, Wet Prep HPF POC FEW (*)    All other  components within normal limits  URINALYSIS, ROUTINE W REFLEX MICROSCOPIC - Abnormal; Notable for the following:    APPearance CLOUDY (*)    Specific Gravity, Urine 1.031 (*)    Bilirubin Urine SMALL (*)    Leukocytes, UA SMALL (*)    All other components within normal limits  URINE MICROSCOPIC-ADD ON - Abnormal; Notable for the following:    Squamous Epithelial / LPF MANY (*)    Bacteria, UA MANY (*)    All other components within normal limits  CBC - Abnormal; Notable for the following:    WBC 3.3 (*)    All other components within normal limits  COMPREHENSIVE METABOLIC PANEL - Abnormal; Notable for the following:    Total Protein 8.5 (*)    All other components within normal limits  GC/CHLAMYDIA PROBE AMP  LIPASE, BLOOD  HIV ANTIBODY (ROUTINE TESTING)   Imaging Review No results found.   EKG Interpretation None      MDM   Final diagnoses:  Abdominal pain  Bacterial vaginitis    Patient's abd exam is soft and not very concerning. Given the duration and location of her symptoms, it is most likely cyst related. Does not seem c/w torsion given it is constant and no acute worsening. No hematuria or colicky history to suggest renal colic. No fever, would be atypical for diverticulitis. Pelvic exam consistent with BP. Patient will be treated with Flagyl, pain and nausea medicines and followup with a gynecologist.    Ephraim Hamburger, MD 12/20/13 [phone]

## 2013-12-20 NOTE — ED Notes (Signed)
Pelvic cart set up and at pt bedside, ready for dr

## 2013-12-21 LAB — HIV ANTIBODY (ROUTINE TESTING W REFLEX): HIV: NONREACTIVE

## 2013-12-21 LAB — GC/CHLAMYDIA PROBE AMP
CT PROBE, AMP APTIMA: NEGATIVE
GC PROBE AMP APTIMA: NEGATIVE

## 2014-02-05 DIAGNOSIS — D126 Benign neoplasm of colon, unspecified: Secondary | ICD-10-CM

## 2014-02-05 HISTORY — DX: Benign neoplasm of colon, unspecified: D12.6

## 2014-05-08 ENCOUNTER — Emergency Department (HOSPITAL_BASED_OUTPATIENT_CLINIC_OR_DEPARTMENT_OTHER)
Admission: EM | Admit: 2014-05-08 | Discharge: 2014-05-08 | Disposition: A | Discharge status: Home / Self Care | Payer: BC Managed Care – PPO | Attending: Emergency Medicine | Admitting: Emergency Medicine

## 2014-05-08 ENCOUNTER — Emergency Department (HOSPITAL_BASED_OUTPATIENT_CLINIC_OR_DEPARTMENT_OTHER): Payer: BC Managed Care – PPO

## 2014-05-08 DIAGNOSIS — W208XXA Other cause of strike by thrown, projected or falling object, initial encounter: Secondary | ICD-10-CM | POA: Insufficient documentation

## 2014-05-08 DIAGNOSIS — Z791 Long term (current) use of non-steroidal anti-inflammatories (NSAID): Secondary | ICD-10-CM | POA: Diagnosis not present

## 2014-05-08 DIAGNOSIS — H00019 Hordeolum externum unspecified eye, unspecified eyelid: Secondary | ICD-10-CM | POA: Insufficient documentation

## 2014-05-08 DIAGNOSIS — S9030XA Contusion of unspecified foot, initial encounter: Secondary | ICD-10-CM | POA: Diagnosis not present

## 2014-05-08 DIAGNOSIS — Z792 Long term (current) use of antibiotics: Secondary | ICD-10-CM | POA: Diagnosis not present

## 2014-05-08 DIAGNOSIS — S8990XA Unspecified injury of unspecified lower leg, initial encounter: Secondary | ICD-10-CM | POA: Diagnosis present

## 2014-05-08 DIAGNOSIS — S99929A Unspecified injury of unspecified foot, initial encounter: Secondary | ICD-10-CM

## 2014-05-08 DIAGNOSIS — Z79899 Other long term (current) drug therapy: Secondary | ICD-10-CM | POA: Insufficient documentation

## 2014-05-08 DIAGNOSIS — S9032XA Contusion of left foot, initial encounter: Secondary | ICD-10-CM

## 2014-05-08 DIAGNOSIS — Y9389 Activity, other specified: Secondary | ICD-10-CM | POA: Insufficient documentation

## 2014-05-08 DIAGNOSIS — Z87442 Personal history of urinary calculi: Secondary | ICD-10-CM | POA: Insufficient documentation

## 2014-05-08 DIAGNOSIS — Y929 Unspecified place or not applicable: Secondary | ICD-10-CM | POA: Insufficient documentation

## 2014-05-08 DIAGNOSIS — S99919A Unspecified injury of unspecified ankle, initial encounter: Secondary | ICD-10-CM

## 2014-05-08 MED ORDER — IBUPROFEN 800 MG PO TABS: 800.0000 mg | ORAL_TABLET | Freq: Three times a day (TID) | ORAL | Status: DC

## 2014-05-08 NOTE — Discharge Instructions (Signed)
Take ibuprofen as directed as needed for pain. Rest, ice and elevate your foot. Contusion A contusion is a deep bruise. Contusions are the result of an injury that caused bleeding under the skin. The contusion may turn blue, purple, or yellow. Minor injuries will give you a painless contusion, but more severe contusions may stay painful and swollen for a few weeks.  CAUSES  A contusion is usually caused by a blow, trauma, or direct force to an area of the body. SYMPTOMS   Swelling and redness of the injured area.  Bruising of the injured area.  Tenderness and soreness of the injured area.  Pain. DIAGNOSIS  The diagnosis can be made by taking a history and physical exam. An X-ray, CT scan, or MRI may be needed to determine if there were any associated injuries, such as fractures. TREATMENT  Specific treatment will depend on what area of the body was injured. In general, the best treatment for a contusion is resting, icing, elevating, and applying cold compresses to the injured area. Over-the-counter medicines may also be recommended for pain control. Ask your caregiver what the best treatment is for your contusion. HOME CARE INSTRUCTIONS   Put ice on the injured area.  Put ice in a plastic bag.  Place a towel between your skin and the bag.  Leave the ice on for 15-20 minutes, 3-4 times a day, or as directed by your health care provider.  Only take over-the-counter or prescription medicines for pain, discomfort, or fever as directed by your caregiver. Your caregiver may recommend avoiding anti-inflammatory medicines (aspirin, ibuprofen, and naproxen) for 48 hours because these medicines may increase bruising.  Rest the injured area.  If possible, elevate the injured area to reduce swelling. SEEK IMMEDIATE MEDICAL CARE IF:   You have increased bruising or swelling.  You have pain that is getting worse.  Your swelling or pain is not relieved with medicines. MAKE SURE YOU:    Understand these instructions.  Will watch your condition.  Will get help right away if you are not doing well or get worse. Document Released: 06/20/2005 Document Revised: 09/15/2013 Document Reviewed: 07/16/2011 [facility] Patient Information 2015 [place], [place]. This information is not intended to replace advice given to you by your health care provider. Make sure you discuss any questions you have with your health care provider. RICE: Routine Care for Injuries The routine care of many injuries includes Rest, Ice, Compression, and Elevation (RICE). HOME CARE INSTRUCTIONS  Rest is needed to allow your body to heal. Routine activities can usually be resumed when comfortable. Injured tendons and bones can take up to 6 weeks to heal. Tendons are the cord-like structures that attach muscle to bone.  Ice following an injury helps keep the swelling down and reduces pain.  Put ice in a plastic bag.  Place a towel between your skin and the bag.  Leave the ice on for 15-20 minutes, 3-4 times a day, or as directed by your health care provider. Do this while awake, for the first 24 to 48 hours. After that, continue as directed by your caregiver.  Compression helps keep swelling down. It also gives support and helps with discomfort. If an elastic bandage has been applied, it should be removed and reapplied every 3 to 4 hours. It should not be applied tightly, but firmly enough to keep swelling down. Watch fingers or toes for swelling, bluish discoloration, coldness, numbness, or excessive pain. If any of these problems occur, remove the bandage and  reapply loosely. Contact your caregiver if these problems continue.  Elevation helps reduce swelling and decreases pain. With extremities, such as the arms, hands, legs, and feet, the injured area should be placed near or above the level of the heart, if possible. SEEK IMMEDIATE MEDICAL CARE IF:  You have persistent pain and swelling.  You develop  redness, numbness, or unexpected weakness.  Your symptoms are getting worse rather than improving after several days. These symptoms may indicate that further evaluation or further X-rays are needed. Sometimes, X-rays may not show a small broken bone (fracture) until 1 week or 10 days later. Make a follow-up appointment with your caregiver. Ask when your X-ray results will be ready. Make sure you get your X-ray results. Document Released: 12/23/2000 Document Revised: 09/15/2013 Document Reviewed: 02/09/2011 [facility] Patient Information 2015 [place], [place]. This information is not intended to replace advice given to you by your health care provider. Make sure you discuss any questions you have with your health care provider.

## 2014-05-08 NOTE — ED Notes (Signed)
Dropped toolbox on left foot today. Also complaining of right eye irritation.

## 2014-05-08 NOTE — ED Provider Notes (Signed)
CSN: [id number]     Arrival date & time 05/08/14  2016 History   First MD Initiated Contact with Patient 05/08/14 2041     Chief Complaint  Patient presents with  . Foot Injury     (Consider location/radiation/quality/duration/timing/severity/associated sxs/prior Treatment) HPI Comments: Patient is a 43 year old female who presents to the emergency department complaining of left foot pain x1 day. Patient reports she was helping her son and accidentally dropped a 2 blocks onto her left foot. States he immediately began to swell and she developed pain. Pain worse with pressure. She has been applying ice and heat with minimal relief. Denies numbness or tingling. She is also complaining of a bump to her right eyelid that has been there for about a week. States the bump is painful. Denies eye pain or vision change.  Patient is a 43 y.o. female presenting with foot injury. The history is provided by the patient.  Foot Injury Associated symptoms: no fever     Past Medical History  Diagnosis Date  . Kidney calculi    Past Surgical History  Procedure Laterality Date  . Abdominal hysterectomy     No family history on file. History  Substance Use Topics  . Smoking status: Never Smoker   . Smokeless tobacco: Not on file  . Alcohol Use: No   OB History   Grav Para Term Preterm Abortions TAB SAB Ect Mult Living                 Review of Systems  Constitutional: Negative for fever.  HENT: Negative.   Eyes:       + "bump" on right eyelid.  Musculoskeletal:       + L foot pain and swelling.  Skin: Negative for wound.      Allergies  Review of patient's allergies indicates no known allergies.  Home Medications   Prior to Admission medications   Medication Sig Start Date End Date Taking? Authorizing Provider  ciprofloxacin (CIPRO) 500 MG tablet Take 1 tablet (500 mg total) by mouth 2 (two) times daily. 09/02/12   Mylinda Latina, MD  HYDROcodone-acetaminophen (NORCO) 5-325 MG per  tablet Take 1 tablet by mouth every 4 (four) hours as needed. 12/20/13   Ephraim Hamburger, MD  ibuprofen (ADVIL,MOTRIN) 800 MG tablet Take 1 tablet (800 mg total) by mouth 3 (three) times daily. 07/13/13   Hannah Muthersbaugh, PA-C  ibuprofen (ADVIL,MOTRIN) 800 MG tablet Take 1 tablet (800 mg total) by mouth every 8 (eight) hours as needed for moderate pain. 12/20/13   Ephraim Hamburger, MD  ibuprofen (ADVIL,MOTRIN) 800 MG tablet Take 1 tablet (800 mg total) by mouth 3 (three) times daily. 05/08/14   Illene Labrador, PA-C  metroNIDAZOLE (FLAGYL) 500 MG tablet Take 1 tablet (500 mg total) by mouth 2 (two) times daily. 09/02/12   Mylinda Latina, MD  metroNIDAZOLE (FLAGYL) 500 MG tablet Take 1 tablet (500 mg total) by mouth 2 (two) times daily. One po bid x 7 days 07/13/13   Jarrett Soho Muthersbaugh, PA-C  metroNIDAZOLE (FLAGYL) 500 MG tablet Take 1 tablet (500 mg total) by mouth 2 (two) times daily. One po bid x 7 days 12/20/13   Ephraim Hamburger, MD  ondansetron (ZOFRAN) 4 MG tablet Take 1 tablet (4 mg total) by mouth every 6 (six) hours as needed for nausea or vomiting. 12/20/13   Ephraim Hamburger, MD  polyethylene glycol powder (GLYCOLAX/MIRALAX) powder Take 17 g by mouth 2 (two) times daily. Until daily  soft stools  OTC 07/13/13   Hannah Muthersbaugh, PA-C   BP 116/80  Pulse 68  Temp(Src) 97.9 F (36.6 C) (Oral)  Resp 16  Wt 154 lb (69.854 kg)  SpO2 100%  LMP 04/05/2011 Physical Exam  Nursing note and vitals reviewed. Constitutional: She is oriented to person, place, and time. She appears well-developed and well-nourished. No distress.  HENT:  Head: Normocephalic and atraumatic.  Mouth/Throat: Oropharynx is clear and moist.  Eyes: Conjunctivae and EOM are normal. Pupils are equal, round, and reactive to light. Right eye exhibits hordeolum (external).  Neck: Normal range of motion. Neck supple.  Cardiovascular: Normal rate, regular rhythm and normal heart sounds.   +2 DP pulse on left. Cap refill <  3 seconds.  Pulmonary/Chest: Effort normal and breath sounds normal. No respiratory distress.  Musculoskeletal: Normal range of motion. She exhibits no edema.  Left foot tender to palpation over base of metatarsals with swelling. Full range of motion of left foot and ankle.  Neurological: She is alert and oriented to person, place, and time. No sensory deficit.  Skin: Skin is warm and dry.  Psychiatric: She has a normal mood and affect. Her behavior is normal.    ED Course  Procedures (including critical care time) Labs Review Labs Reviewed - No data to display  Imaging Review Dg Foot Complete Left  05/08/2014   CLINICAL DATA:  Left foot pain.  Dropped heavy object on foot.  EXAM: LEFT FOOT - COMPLETE 3+ VIEW  COMPARISON:  None.  FINDINGS: There is no evidence of fracture or dislocation. There is no evidence of arthropathy or other focal bone abnormality. Soft tissues are unremarkable.  IMPRESSION: Negative.   Electronically Signed   By: Rolm Baptise M.D.   On: 05/08/2014 21:02     EKG Interpretation None      MDM   Final diagnoses:  Foot contusion, left, initial encounter   Patient presenting with left foot pain after injury. Neurovascularly intact. X-ray without any acute finding. Ace wrap applied. Advised RICE, NSAIDs. Stable for d/c. Return precautions given. Patient states understanding of treatment care plan and is agreeable.   Illene Labrador, PA-C 05/08/14 2117

## 2014-05-08 NOTE — ED Notes (Signed)
Pt discharged to home with family. NAD.  

## 2014-05-08 NOTE — ED Provider Notes (Signed)
History/physical exam/procedure(s) were performed by non-physician practitioner and as supervising physician I was immediately available for consultation/collaboration. I have reviewed all notes and am in agreement with care and plan.   Shaune Pollack, MD 05/08/14 [phone]

## 2014-05-24 ENCOUNTER — Emergency Department (HOSPITAL_BASED_OUTPATIENT_CLINIC_OR_DEPARTMENT_OTHER)
Admission: EM | Admit: 2014-05-24 | Discharge: 2014-05-24 | Disposition: A | Discharge status: Home / Self Care | Payer: BC Managed Care – PPO | Attending: Emergency Medicine | Admitting: Emergency Medicine

## 2014-05-24 ENCOUNTER — Encounter (HOSPITAL_BASED_OUTPATIENT_CLINIC_OR_DEPARTMENT_OTHER): Payer: Self-pay | Admitting: Emergency Medicine

## 2014-05-24 DIAGNOSIS — Z79899 Other long term (current) drug therapy: Secondary | ICD-10-CM | POA: Diagnosis not present

## 2014-05-24 DIAGNOSIS — D72819 Decreased white blood cell count, unspecified: Secondary | ICD-10-CM

## 2014-05-24 DIAGNOSIS — Z87442 Personal history of urinary calculi: Secondary | ICD-10-CM | POA: Insufficient documentation

## 2014-05-24 DIAGNOSIS — E876 Hypokalemia: Secondary | ICD-10-CM | POA: Insufficient documentation

## 2014-05-24 DIAGNOSIS — R42 Dizziness and giddiness: Secondary | ICD-10-CM | POA: Insufficient documentation

## 2014-05-24 DIAGNOSIS — Z791 Long term (current) use of non-steroidal anti-inflammatories (NSAID): Secondary | ICD-10-CM | POA: Insufficient documentation

## 2014-05-24 DIAGNOSIS — R634 Abnormal weight loss: Secondary | ICD-10-CM | POA: Insufficient documentation

## 2014-05-24 DIAGNOSIS — Z792 Long term (current) use of antibiotics: Secondary | ICD-10-CM | POA: Diagnosis not present

## 2014-05-24 LAB — CBC WITH DIFFERENTIAL/PLATELET
Basophils Absolute: 0 10*3/uL (ref 0.0–0.1)
Basophils Relative: 0 % (ref 0–1)
EOS ABS: 0 10*3/uL (ref 0.0–0.7)
Eosinophils Relative: 1 % (ref 0–5)
HEMATOCRIT: 34.7 % — AB (ref 36.0–46.0)
Hemoglobin: 11.5 g/dL — ABNORMAL LOW (ref 12.0–15.0)
LYMPHS ABS: 1.1 10*3/uL (ref 0.7–4.0)
LYMPHS PCT: 33 % (ref 12–46)
MCH: 28.5 pg (ref 26.0–34.0)
MCHC: 33.1 g/dL (ref 30.0–36.0)
MCV: 85.9 fL (ref 78.0–100.0)
Monocytes Absolute: 0.4 10*3/uL (ref 0.1–1.0)
Monocytes Relative: 12 % (ref 3–12)
NEUTROS ABS: 1.7 10*3/uL (ref 1.7–7.7)
Neutrophils Relative %: 54 % (ref 43–77)
Platelets: 233 10*3/uL (ref 150–400)
RBC: 4.04 MIL/uL (ref 3.87–5.11)
RDW: 13.5 % (ref 11.5–15.5)
WBC: 3.2 10*3/uL — ABNORMAL LOW (ref 4.0–10.5)

## 2014-05-24 LAB — COMPREHENSIVE METABOLIC PANEL
ALBUMIN: 3.6 g/dL (ref 3.5–5.2)
ALT: 7 U/L (ref 0–35)
AST: 15 U/L (ref 0–37)
Alkaline Phosphatase: 44 U/L (ref 39–117)
Anion gap: 12 (ref 5–15)
BILIRUBIN TOTAL: 0.3 mg/dL (ref 0.3–1.2)
BUN: 7 mg/dL (ref 6–23)
CHLORIDE: 101 meq/L (ref 96–112)
CO2: 25 mEq/L (ref 19–32)
Calcium: 9.1 mg/dL (ref 8.4–10.5)
Creatinine, Ser: 0.8 mg/dL (ref 0.50–1.10)
GFR calc Af Amer: >90 mL/min (ref >90)
GFR calc non Af Amer: >90 mL/min (ref >90)
GLUCOSE: 83 mg/dL (ref 70–99)
Potassium: 3.4 mEq/L — ABNORMAL LOW (ref 3.7–5.3)
Sodium: 138 mEq/L (ref 137–147)
Total Protein: 8 g/dL (ref 6.0–8.3)

## 2014-05-24 MED ORDER — POTASSIUM CHLORIDE ER 10 MEQ PO TBCR: 10.0000 meq | EXTENDED_RELEASE_TABLET | Freq: Every day | ORAL | Status: DC

## 2014-05-24 NOTE — ED Provider Notes (Signed)
CSN: [id number]     Arrival date & time 05/24/14  1628 History  This chart was scribed for Dot Lanes, MD, by Neta Ehlers, ED Scribe. This patient was seen in room MH02/MH02 and the patient's care was started at 5:54 PM.   First MD Initiated Contact with Patient 05/24/14 1749     Chief Complaint  Patient presents with  . Dizziness    The history is provided by the patient. No language interpreter was used.   HPI Comments: Christina Holmes is a 43 y.o. female who presents to the Emergency Department complaining of . Christina Holmes reports diarrhea and nausea with food intake for several months. She endorses weight loss of twenty pounds in the past few months due to decreased food intake. Additionally, she voices lightheadedness in the morning when she attempts to stand. She reports she was hospitalized at [facility] three months ago because she of gastric issues and decreased hemoglobin; she denies blood transfusion. She states she was also treated for GERD without resolution. She also reports she was told by an endocrinologist that her "white blood cells are eating my red blood cells." Christina Holmes has an appointment with a hematologist in two weeks. She denies daily medications currently.   Past Medical History  Diagnosis Date  . Kidney calculi    Past Surgical History  Procedure Laterality Date  . Abdominal hysterectomy     No family history on file. History  Substance Use Topics  . Smoking status: Never Smoker   . Smokeless tobacco: Not on file  . Alcohol Use: No   No OB history provided.  Review of Systems  Constitutional: Positive for appetite change and unexpected weight change.  Neurological: Positive for light-headedness.      Allergies  Review of patient's allergies indicates no known allergies.  Home Medications   Prior to Admission medications   Medication Sig Start Date End Date Taking? Authorizing Provider  ciprofloxacin (CIPRO) 500 MG tablet  Take 1 tablet (500 mg total) by mouth 2 (two) times daily. 09/02/12   Mylinda Latina, MD  HYDROcodone-acetaminophen (NORCO) 5-325 MG per tablet Take 1 tablet by mouth every 4 (four) hours as needed. 12/20/13   Ephraim Hamburger, MD  ibuprofen (ADVIL,MOTRIN) 800 MG tablet Take 1 tablet (800 mg total) by mouth 3 (three) times daily. 07/13/13   Hannah Muthersbaugh, PA-C  ibuprofen (ADVIL,MOTRIN) 800 MG tablet Take 1 tablet (800 mg total) by mouth every 8 (eight) hours as needed for moderate pain. 12/20/13   Ephraim Hamburger, MD  ibuprofen (ADVIL,MOTRIN) 800 MG tablet Take 1 tablet (800 mg total) by mouth 3 (three) times daily. 05/08/14   Illene Labrador, PA-C  metroNIDAZOLE (FLAGYL) 500 MG tablet Take 1 tablet (500 mg total) by mouth 2 (two) times daily. 09/02/12   Mylinda Latina, MD  metroNIDAZOLE (FLAGYL) 500 MG tablet Take 1 tablet (500 mg total) by mouth 2 (two) times daily. One po bid x 7 days 07/13/13   Jarrett Soho Muthersbaugh, PA-C  metroNIDAZOLE (FLAGYL) 500 MG tablet Take 1 tablet (500 mg total) by mouth 2 (two) times daily. One po bid x 7 days 12/20/13   Ephraim Hamburger, MD  ondansetron (ZOFRAN) 4 MG tablet Take 1 tablet (4 mg total) by mouth every 6 (six) hours as needed for nausea or vomiting. 12/20/13   Ephraim Hamburger, MD  polyethylene glycol powder (GLYCOLAX/MIRALAX) powder Take 17 g by mouth 2 (two) times daily. Until daily soft stools  OTC  07/13/13   Hannah Muthersbaugh, PA-C   Triage Vitals: BP 119/76  Pulse 67  Temp(Src) 99.1 F (37.3 C) (Oral)  Resp 18  Wt 152 lb 9 oz (69.202 kg)  SpO2 100%  LMP 04/05/2011  Physical Exam Physical Exam  Nursing note and vitals reviewed. Constitutional: She is oriented to person, place, and time. She appears well-developed and well-nourished. No distress.  HENT:  Head: Normocephalic and atraumatic.  Eyes: Pupils are equal, round, and reactive to light.  Neck: Normal range of motion.  Cardiovascular: Normal rate and intact distal pulses.    Pulmonary/Chest: No respiratory distress.  Abdominal: Normal appearance. She exhibits no distension.  Musculoskeletal: Normal range of motion.  Neurological: She is alert and oriented to person, place, and time. No cranial nerve deficit.  Skin: Skin is warm and dry. No rash noted.  Psychiatric: She has a normal mood and affect. Her behavior is normal.   ED Course  Procedures (including critical care time)  DIAGNOSTIC STUDIES: Oxygen Saturation is 100% on room air, normal by my interpretation.    COORDINATION OF CARE:  5:59 PM- Discussed treatment plan with patient, and the patient agreed to the plan. The plan includes lab work.   Labs Review Labs Reviewed  COMPREHENSIVE METABOLIC PANEL - Abnormal; Notable for the following:    Potassium 3.4 (*)    All other components within normal limits  CBC WITH DIFFERENTIAL - Abnormal; Notable for the following:    WBC 3.2 (*)    Hemoglobin 11.5 (*)    HCT 34.7 (*)    All other components within normal limits    Imaging Review No results found.   EKG Interpretation   Date/Time:  Monday May 24 2014 18:36:28 EDT Ventricular Rate:  59 PR Interval:    QRS Duration: 76 QT Interval:  402 QTC Calculation: 397 R Axis:   79 Text Interpretation:  Sinus bradycardia Abnormal ECG No previous tracing  Confirmed by Claretta Kendra  MD, Brycelynn Stampley ([id number]) on 05/24/2014 6:39:33 PM      MDM   Final diagnoses:  Weight loss  Leukocytopenia  Hypokalemia   I personally performed the services described in this documentation, which was scribed in my presence. The recorded information has been reviewed and considered.    Dot Lanes, MD 05/24/14 2037

## 2014-05-24 NOTE — Discharge Instructions (Signed)
Potassium Content of Foods Potassium is a mineral found in many foods and drinks. It helps keep fluids and minerals balanced in your body and affects how steadily your heart beats. Potassium also helps control your blood pressure and keep your muscles and nervous system healthy. Certain health conditions and medicines may change the balance of potassium in your body. When this happens, you can help balance your level of potassium through the foods that you do or do not eat. Your health care provider or dietitian may recommend an amount of potassium that you should have each day. The following lists of foods provide the amount of potassium (in parentheses) per serving in each item. HIGH IN POTASSIUM  The following foods and beverages have 200 mg or more of potassium per serving:  Apricots, 2 raw or 5 dry (200 mg).  Artichoke, 1 medium (345 mg).  Avocado, raw,  each (245 mg).  Banana, 1 medium (425 mg).  Beans, lima, or baked beans, canned,  cup (280 mg).  Beans, white, canned,  cup (595 mg).  Beef roast, 3 oz (320 mg).  Beef, ground, 3 oz (270 mg).  Beets, raw or cooked,  cup (260 mg).  Bran muffin, 2 oz (300 mg).  Broccoli,  cup (230 mg).  Brussels sprouts,  cup (250 mg).  Cantaloupe,  cup (215 mg).  Cereal, 100% bran,  cup (200-400 mg).  Cheeseburger, single, fast food, 1 each (225-400 mg).  Chicken, 3 oz (220 mg).  Clams, canned, 3 oz (535 mg).  Crab, 3 oz (225 mg).  Dates, 5 each (270 mg).  Dried beans and peas,  cup (300-475 mg).  Figs, dried, 2 each (260 mg).  Fish: halibut, tuna, cod, snapper, 3 oz (480 mg).  Fish: salmon, haddock, swordfish, perch, 3 oz (300 mg).  Fish, tuna, canned 3 oz (200 mg).  [place] fries, fast food, 3 oz (470 mg).  Granola with fruit and nuts,  cup (200 mg).  Grapefruit juice,  cup (200 mg).  Greens, beet,  cup (655 mg).  Honeydew melon,  cup (200 mg).  Kale, raw, 1 cup (300 mg).  Kiwi, 1 medium (240  mg).  Kohlrabi, rutabaga, parsnips,  cup (280 mg).  Lentils,  cup (365 mg).  Mango, 1 each (325 mg).  Milk, chocolate, 1 cup (420 mg).  Milk: nonfat, low-fat, whole, buttermilk, 1 cup (350-380 mg).  Molasses, 1 Tbsp (295 mg).  Mushrooms,  cup (280) mg.  Nectarine, 1 each (275 mg).  Nuts: almonds, peanuts, hazelnuts, [place], cashew, mixed, 1 oz (200 mg).  Nuts, pistachios, 1 oz (295 mg).  Orange, 1 each (240 mg).  Orange juice,  cup (235 mg).  Papaya, medium,  fruit (390 mg).  Peanut butter, chunky, 2 Tbsp (240 mg).  Peanut butter, smooth, 2 Tbsp (210 mg).  Pear, 1 medium (200 mg).  Pomegranate, 1 whole (400 mg).  Pomegranate juice,  cup (215 mg).  Pork, 3 oz (350 mg).  Potato chips, salted, 1 oz (465 mg).  Potato, baked with skin, 1 medium (925 mg).  Potatoes, boiled,  cup (255 mg).  Potatoes, mashed,  cup (330 mg).  Prune juice,  cup (370 mg).  Prunes, 5 each (305 mg).  Pudding, chocolate,  cup (230 mg).  Pumpkin, canned,  cup (250 mg).  Raisins, seedless,  cup (270 mg).  Seeds, sunflower or pumpkin, 1 oz (240 mg).  Soy milk, 1 cup (300 mg).  Spinach,  cup (420 mg).  Spinach, canned,  cup (370 mg).  Sweet potato, baked with skin, 1 medium (450 mg).  Swiss chard,  cup (480 mg).  Tomato or vegetable juice,  cup (275 mg).  Tomato sauce or puree,  cup (400-550 mg).  Tomato, raw, 1 medium (290 mg).  Tomatoes, canned,  cup (200-300 mg).  [place], 3 oz (250 mg).  Wheat germ, 1 oz (250 mg).  Winter squash,  cup (250 mg).  Yogurt, plain or fruited, 6 oz (260-435 mg).  Zucchini,  cup (220 mg). MODERATE IN POTASSIUM The following foods and beverages have 50-200 mg of potassium per serving:  Apple, 1 each (150 mg).  Apple juice,  cup (150 mg).  Applesauce,  cup (90 mg).  Apricot nectar,  cup (140 mg).  Asparagus, small spears,  cup or 6 spears (155 mg).  Bagel, cinnamon raisin, 1 each (130 mg).  Bagel,  egg or plain, 4 in., 1 each (70 mg).  Beans, green,  cup (90 mg).  Beans, yellow,  cup (190 mg).  Beer, regular, 12 oz (100 mg).  Beets, canned,  cup (125 mg).  Blackberries,  cup (115 mg).  Blueberries,  cup (60 mg).  Bread, whole wheat, 1 slice (70 mg).  Broccoli, raw,  cup (145 mg).  Cabbage,  cup (150 mg).  Carrots, cooked or raw,  cup (180 mg).  Cauliflower, raw,  cup (150 mg).  Celery, raw,  cup (155 mg).  Cereal, bran flakes, cup (120-150 mg).  Cheese, cottage,  cup (110 mg).  Cherries, 10 each (150 mg).  Chocolate, 1 oz bar (165 mg).  Coffee, brewed 6 oz (90 mg).  Corn,  cup or 1 ear (195 mg).  Cucumbers,  cup (80 mg).  Egg, large, 1 each (60 mg).  Eggplant,  cup (60 mg).  Endive, raw, cup (80 mg).  English muffin, 1 each (65 mg).  Fish, orange roughy, 3 oz (150 mg).  Frankfurter, beef or pork, 1 each (75 mg).  Fruit cocktail,  cup (115 mg).  Grape juice,  cup (170 mg).  Grapefruit,  fruit (175 mg).  Grapes,  cup (155 mg).  Greens: kale, turnip, collard,  cup (110-150 mg).  Ice cream or frozen yogurt, chocolate,  cup (175 mg).  Ice cream or frozen yogurt, vanilla,  cup (120-150 mg).  Lemons, limes, 1 each (80 mg).  Lettuce, all types, 1 cup (100 mg).  Mixed vegetables,  cup (150 mg).  Mushrooms, raw,  cup (110 mg).  Nuts: walnuts, pecans, or macadamia, 1 oz (125 mg).  Oatmeal,  cup (80 mg).  Okra,  cup (110 mg).  Onions, raw,  cup (120 mg).  Peach, 1 each (185 mg).  Peaches, canned,  cup (120 mg).  Pears, canned,  cup (120 mg).  Peas, green, frozen,  cup (90 mg).  Peppers, green,  cup (130 mg).  Peppers, red,  cup (160 mg).  Pineapple juice,  cup (165 mg).  Pineapple, fresh or canned,  cup (100 mg).  Plums, 1 each (105 mg).  Pudding, vanilla,  cup (150 mg).  Raspberries,  cup (90 mg).  Rhubarb,  cup (115 mg).  Rice, wild,  cup (80 mg).  Shrimp, 3 oz (155  mg).  Spinach, raw, 1 cup (170 mg).  Strawberries,  cup (125 mg).  Summer squash  cup (175-200 mg).  Swiss chard, raw, 1 cup (135 mg).  Tangerines, 1 each (140 mg).  Tea, brewed, 6 oz (65 mg).  Turnips,  cup (140 mg).  Watermelon,  cup (85 mg).  Wine, red, table,  5 oz (180 mg).  Wine, white, table, 5 oz (100 mg). LOW IN POTASSIUM The following foods and beverages have less than 50 mg of potassium per serving.  Bread, white, 1 slice (30 mg).  Carbonated beverages, 12 oz (less than 5 mg).  Cheese, 1 oz (20-30 mg).  Cranberries,  cup (45 mg).  Cranberry juice cocktail,  cup (20 mg).  Fats and oils, 1 Tbsp (less than 5 mg).  Hummus, 1 Tbsp (32 mg).  Nectar: papaya, mango, or pear,  cup (35 mg).  Rice, white or brown,  cup (50 mg).  Spaghetti or macaroni,  cup cooked (30 mg).  Tortilla, flour or corn, 1 each (50 mg).  Waffle, 4 in., 1 each (50 mg).  Water chestnuts,  cup (40 mg). Document Released: 04/24/2005 Document Revised: 09/15/2013 Document Reviewed: 08/07/2013 [facility] Patient Information 2015 [place], [place]. This information is not intended to replace advice given to you by your health care provider. Make sure you discuss any questions you have with your health care provider.  Hypokalemia Hypokalemia means that the amount of potassium in the blood is lower than normal.Potassium is a chemical, called an electrolyte, that helps regulate the amount of fluid in the body. It also stimulates muscle contraction and helps nerves function properly.Most of the body's potassium is inside of cells, and only a very small amount is in the blood. Because the amount in the blood is so small, minor changes can be life-threatening. CAUSES  Antibiotics.  Diarrhea or vomiting.  Using laxatives too much, which can cause diarrhea.  Chronic kidney disease.  Water pills (diuretics).  Eating disorders (bulimia).  Low magnesium level.  Sweating a  lot. SIGNS AND SYMPTOMS  Weakness.  Constipation.  Fatigue.  Muscle cramps.  Mental confusion.  Skipped heartbeats or irregular heartbeat (palpitations).  Tingling or numbness. DIAGNOSIS  Your health care provider can diagnose hypokalemia with blood tests. In addition to checking your potassium level, your health care provider may also check other lab tests. TREATMENT Hypokalemia can be treated with potassium supplements taken by mouth or adjustments in your current medicines. If your potassium level is very low, you may need to get potassium through a vein (IV) and be monitored in the hospital. A diet high in potassium is also helpful. Foods high in potassium are:  Nuts, such as peanuts and pistachios.  Seeds, such as sunflower seeds and pumpkin seeds.  Peas, lentils, and lima beans.  Whole grain and bran cereals and breads.  Fresh fruit and vegetables, such as apricots, avocado, bananas, cantaloupe, kiwi, oranges, tomatoes, asparagus, and potatoes.  Orange and tomato juices.  Red meats.  Fruit yogurt. HOME CARE INSTRUCTIONS  Take all medicines as prescribed by your health care provider.  Maintain a healthy diet by including nutritious food, such as fruits, vegetables, nuts, whole grains, and lean meats.  If you are taking a laxative, be sure to follow the directions on the label. SEEK MEDICAL CARE IF:  Your weakness gets worse.  You feel your heart pounding or racing.  You are vomiting or having diarrhea.  You are diabetic and having trouble keeping your blood glucose in the normal range. SEEK IMMEDIATE MEDICAL CARE IF:  You have chest pain, shortness of breath, or dizziness.  You are vomiting or having diarrhea for more than 2 days.  You faint. MAKE SURE YOU:   Understand these instructions.  Will watch your condition.  Will get help right away if you are not doing well or get worse.  Document Released: 09/10/2005 Document Revised: 07/01/2013  Document Reviewed: 03/13/2013 [facility] Patient Information 2015 [place], [place]. This information is not intended to replace advice given to you by your health care provider. Make sure you discuss any questions you have with your health care provider.  Leukopenia Leukopenia is a condition in which you have a low number of white blood cells. White blood cells help your body fight infections. The number of white blood cells in the body varies from person to person. Leukopenia is usually defined as having fewer than 4,000 white blood cells in 1 microliter of blood. There are five types of white blood cells. Two types make up most of your white blood cell count. These are neutrophils and lymphocytes. When your level of neutrophils is low, it is called neutropenia. When your lymphocytes are low, it is called lymphocytopenia. Neutropenia is the most dangerous type of leukopenia because it can lead to dangerous infections. CAUSES  Most white blood cells are made in the soft tissue inside your bones (bone marrow). Conditions that damage or suppress bone marrow are the most common causes of leukopenia. These include:  Medicine or X-ray treatments for cancer.  Serious infections.  Cancer of the white blood cells (leukemia or myeloma).  Medicines, including antibiotics, cardiac drugs, steroids, and those used to treat rheumatoid arthritis. Leukopenia also happens when white blood cells are destroyed after leaving your bone marrow. Causes may include:  Liver disease.  Diseases of the immune system (autoimmune disease).  Vitamin B deficiencies. SIGNS AND SYMPTOMS One of the most common signs of leukopenia, especially severe neutropenia, is having a lot of bacterial infections. Different infections have different symptoms. An infection in your lungs may cause coughing. A urinary tract infection may cause frequent urination and a burning sensation. You may also get infections of the blood, skin, rectum,  throat, sinus, or ear. General signs and symptoms of leukopenia include:  Fever.  Fatigue.  Swollen glands (lymph nodes).  Painful mouth ulcers.  Gum disease. DIAGNOSIS  Your health care provider can diagnose leukopenia based on a physical exam and the results of lab tests. During a physical exam, your health care provider will feel for swollen lymph nodes and check whether your spleen is enlarged. Your spleen is an organ on the left side of your body that stores white blood cells. Tests that may be done include:  A complete blood count. This blood test counts each type of white cell.  Bone marrow aspiration. Some bone marrow is removed to be checked under a microscope.  Lymph node biopsy. Some lymph node tissue is removed to be checked under a microscope.  Other types of blood tests or imaging tests. TREATMENT  Treatment of leukopenia depends on the cause. Some common treatments include:  Antibiotics for bacterial infections.  No longer taking medicines that may cause leukopenia.  Vitamin B supplements.  Medicines to stimulate neutrophil production (hematopoietic growth factors) for neutropenia. HOME CARE INSTRUCTIONS  Preventing infection is important if you have leukopenia.  Avoid sick friends and family members.  Wash your hands often.  Do noteat uncooked or undercooked meats.  Wash fruits and vegetables.  Do not eat or drink unpasteurized dairy products.  Get regular dental care, and maintain good dental hygiene.  Keep all follow-up appointments. SEEK MEDICAL CARE IF:  You have chills or a fever.  You have signs or symptoms of infection. SEEK IMMEDIATE MEDICAL CARE IF:  You have a fever or persistent symptoms for more than 2-3 days.  You have trouble breathing.  You have chest pain. MAKE SURE YOU:  Understand these instructions.  Will watch your condition.  Will get help right away if you are not doing well or get worse. Document Released:  09/15/2013 Document Reviewed: 09/15/2013 [facility] Patient Information 2015 [place], [place]. This information is not intended to replace advice given to you by your health care provider. Make sure you discuss any questions you have with your health care provider.

## 2014-05-24 NOTE — ED Notes (Signed)
Dizziness and nausea since May.

## 2014-05-24 NOTE — ED Notes (Signed)
At time of discharge  Dr Audie Pinto at bedside talking with pt for some time about instructions.  When he left I tried to finish, she was very hostile,wouldn't wait for me to take out her IV.  I did manage a gauaze and tape.  She refused to have vitals taken.  Said I didn't need that ,I explained I needed it for the chart.She said OK, but then wouldn't leave the cuff on or let me finish with her other vitals.  [facility]

## 2014-05-30 ENCOUNTER — Emergency Department (HOSPITAL_BASED_OUTPATIENT_CLINIC_OR_DEPARTMENT_OTHER)
Admission: EM | Admit: 2014-05-30 | Discharge: 2014-05-31 | Disposition: A | Discharge status: Home / Self Care | Payer: BC Managed Care – PPO | Attending: Emergency Medicine | Admitting: Emergency Medicine

## 2014-05-30 ENCOUNTER — Encounter (HOSPITAL_BASED_OUTPATIENT_CLINIC_OR_DEPARTMENT_OTHER): Payer: Self-pay | Admitting: Emergency Medicine

## 2014-05-30 ENCOUNTER — Emergency Department (HOSPITAL_BASED_OUTPATIENT_CLINIC_OR_DEPARTMENT_OTHER): Payer: BC Managed Care – PPO

## 2014-05-30 DIAGNOSIS — R42 Dizziness and giddiness: Secondary | ICD-10-CM | POA: Insufficient documentation

## 2014-05-30 DIAGNOSIS — I498 Other specified cardiac arrhythmias: Secondary | ICD-10-CM | POA: Insufficient documentation

## 2014-05-30 DIAGNOSIS — Z79899 Other long term (current) drug therapy: Secondary | ICD-10-CM | POA: Insufficient documentation

## 2014-05-30 DIAGNOSIS — H00019 Hordeolum externum unspecified eye, unspecified eyelid: Secondary | ICD-10-CM | POA: Insufficient documentation

## 2014-05-30 DIAGNOSIS — R5381 Other malaise: Secondary | ICD-10-CM | POA: Diagnosis not present

## 2014-05-30 DIAGNOSIS — Z792 Long term (current) use of antibiotics: Secondary | ICD-10-CM | POA: Diagnosis not present

## 2014-05-30 DIAGNOSIS — R531 Weakness: Secondary | ICD-10-CM

## 2014-05-30 DIAGNOSIS — Z87442 Personal history of urinary calculi: Secondary | ICD-10-CM | POA: Insufficient documentation

## 2014-05-30 DIAGNOSIS — H00011 Hordeolum externum right upper eyelid: Secondary | ICD-10-CM

## 2014-05-30 DIAGNOSIS — Z791 Long term (current) use of non-steroidal anti-inflammatories (NSAID): Secondary | ICD-10-CM | POA: Insufficient documentation

## 2014-05-30 DIAGNOSIS — R5383 Other fatigue: Principal | ICD-10-CM

## 2014-05-30 DIAGNOSIS — R001 Bradycardia, unspecified: Secondary | ICD-10-CM

## 2014-05-30 LAB — CBC WITH DIFFERENTIAL/PLATELET
BASOS ABS: 0 10*3/uL (ref 0.0–0.1)
Basophils Relative: 0 % (ref 0–1)
EOS ABS: 0 10*3/uL (ref 0.0–0.7)
Eosinophils Relative: 1 % (ref 0–5)
HCT: 36.3 % (ref 36.0–46.0)
HEMOGLOBIN: 11.7 g/dL — AB (ref 12.0–15.0)
LYMPHS PCT: 37 % (ref 12–46)
Lymphs Abs: 1.3 10*3/uL (ref 0.7–4.0)
MCH: 27.6 pg (ref 26.0–34.0)
MCHC: 32.2 g/dL (ref 30.0–36.0)
MCV: 85.6 fL (ref 78.0–100.0)
Monocytes Absolute: 0.4 10*3/uL (ref 0.1–1.0)
Monocytes Relative: 13 % — ABNORMAL HIGH (ref 3–12)
NEUTROS PCT: 49 % (ref 43–77)
Neutro Abs: 1.7 10*3/uL (ref 1.7–7.7)
Platelets: 242 10*3/uL (ref 150–400)
RBC: 4.24 MIL/uL (ref 3.87–5.11)
RDW: 13.8 % (ref 11.5–15.5)
WBC: 3.4 10*3/uL — ABNORMAL LOW (ref 4.0–10.5)

## 2014-05-30 LAB — CBG MONITORING, ED: GLUCOSE-CAPILLARY: 72 mg/dL (ref 70–99)

## 2014-05-30 LAB — TROPONIN I

## 2014-05-30 LAB — COMPREHENSIVE METABOLIC PANEL
ALBUMIN: 3.8 g/dL (ref 3.5–5.2)
ALK PHOS: 48 U/L (ref 39–117)
ALT: 6 U/L (ref 0–35)
AST: 14 U/L (ref 0–37)
Anion gap: 13 (ref 5–15)
BUN: 10 mg/dL (ref 6–23)
CO2: 24 mEq/L (ref 19–32)
Calcium: 9.4 mg/dL (ref 8.4–10.5)
Chloride: 102 mEq/L (ref 96–112)
Creatinine, Ser: 0.7 mg/dL (ref 0.50–1.10)
GFR calc Af Amer: >90 mL/min (ref >90)
GFR calc non Af Amer: >90 mL/min (ref >90)
Glucose, Bld: 90 mg/dL (ref 70–99)
POTASSIUM: 3.7 meq/L (ref 3.7–5.3)
SODIUM: 139 meq/L (ref 137–147)
TOTAL PROTEIN: 8.7 g/dL — AB (ref 6.0–8.3)
Total Bilirubin: 0.3 mg/dL (ref 0.3–1.2)

## 2014-05-30 LAB — URINALYSIS, ROUTINE W REFLEX MICROSCOPIC
BILIRUBIN URINE: NEGATIVE
Glucose, UA: NEGATIVE mg/dL
HGB URINE DIPSTICK: NEGATIVE
Ketones, ur: NEGATIVE mg/dL
Leukocytes, UA: NEGATIVE
NITRITE: NEGATIVE
PH: 6.5 (ref 5.0–8.0)
Protein, ur: NEGATIVE mg/dL
SPECIFIC GRAVITY, URINE: 1.023 (ref 1.005–1.030)
Urobilinogen, UA: 2 mg/dL — ABNORMAL HIGH (ref 0.0–1.0)

## 2014-05-30 NOTE — ED Notes (Signed)
Pt at xray

## 2014-05-30 NOTE — ED Notes (Signed)
Pt reports dizziness and continued weakness since last visit. Denies fevers at home

## 2014-05-30 NOTE — ED Provider Notes (Signed)
CSN: [id number]     Arrival date & time 05/30/14  2050 History  This chart was scribed for Wynetta Fines, MD by Cathie Hoops, ED Scribe. The patient was seen in [place]. The patient's care was started at 11:06 PM.      Chief Complaint  Patient presents with  . Dizziness   The history is provided by the patient. No language interpreter was used.   HPI Comments: Christina Holmes is a 43 y.o. female who presents to the Emergency Department complaining of dizziness since May. She describes it as lightheadedness that is worse with standing. Pt has associated nausea and fatigue. Pt notes the nausea is exacerbated by smells, drinking, or food  She states her symptoms are currently unchanged. Pt notes she saw the ED on 8/31 and was told she should be feeling better soon but that has yet to occur. Pt denies fever, vomiting, chest pain or SOB.  Pt notes she has gotten her thyroid checked with negative findings. Pt notes she was diagnosed with acid reflux in May but has not been on medication for this.  Past Medical History  Diagnosis Date  . Kidney calculi    Past Surgical History  Procedure Laterality Date  . Abdominal hysterectomy     No family history on file. History  Substance Use Topics  . Smoking status: Never Smoker   . Smokeless tobacco: Not on file  . Alcohol Use: No   OB History   Grav Para Term Preterm Abortions TAB SAB Ect Mult Living                 Review of Systems A complete 10 system review of systems was obtained and all systems are negative except as noted in the HPI and PMH.     Allergies  Review of patient's allergies indicates no known allergies.  Home Medications   Prior to Admission medications   Medication Sig Start Date End Date Taking? Authorizing Provider  ciprofloxacin (CIPRO) 500 MG tablet Take 1 tablet (500 mg total) by mouth 2 (two) times daily. 09/02/12   Mylinda Latina, MD  HYDROcodone-acetaminophen (NORCO) 5-325 MG per tablet Take 1 tablet by  mouth every 4 (four) hours as needed. 12/20/13   Ephraim Hamburger, MD  ibuprofen (ADVIL,MOTRIN) 800 MG tablet Take 1 tablet (800 mg total) by mouth 3 (three) times daily. 07/13/13   Hannah Muthersbaugh, PA-C  ibuprofen (ADVIL,MOTRIN) 800 MG tablet Take 1 tablet (800 mg total) by mouth every 8 (eight) hours as needed for moderate pain. 12/20/13   Ephraim Hamburger, MD  ibuprofen (ADVIL,MOTRIN) 800 MG tablet Take 1 tablet (800 mg total) by mouth 3 (three) times daily. 05/08/14   Illene Labrador, PA-C  metroNIDAZOLE (FLAGYL) 500 MG tablet Take 1 tablet (500 mg total) by mouth 2 (two) times daily. 09/02/12   Mylinda Latina, MD  metroNIDAZOLE (FLAGYL) 500 MG tablet Take 1 tablet (500 mg total) by mouth 2 (two) times daily. One po bid x 7 days 07/13/13   Jarrett Soho Muthersbaugh, PA-C  metroNIDAZOLE (FLAGYL) 500 MG tablet Take 1 tablet (500 mg total) by mouth 2 (two) times daily. One po bid x 7 days 12/20/13   Ephraim Hamburger, MD  ondansetron (ZOFRAN) 4 MG tablet Take 1 tablet (4 mg total) by mouth every 6 (six) hours as needed for nausea or vomiting. 12/20/13   Ephraim Hamburger, MD  polyethylene glycol powder (GLYCOLAX/MIRALAX) powder Take 17 g by mouth 2 (two) times daily. Until  daily soft stools  OTC 07/13/13   Hannah Muthersbaugh, PA-C  potassium chloride (K-DUR) 10 MEQ tablet Take 1 tablet (10 mEq total) by mouth daily. 05/24/14   Dot Lanes, MD   Triage Vitals: BP 116/86  Pulse 64  Temp(Src) 98.2 F (36.8 C) (Oral)  Resp 16  Ht 5\' 4"  (1.626 m)  Wt 150 lb (68.04 kg)  BMI 25.73 kg/m2  SpO2 100%  LMP 04/05/2011 Physical Exam  Nursing note and vitals reviewed.  CONSTITUTIONAL: Well developed/well nourished HEAD: Normocephalic/atraumatic EYES: EOMI/PERRL. Hordeolum of the upper right eyelid.  ENMT: Mucous membranes moist NECK: supple no meningeal signs SPINE:entire spine nontender CV: S1/S2 noted, no murmurs/rubs/gallops noted LUNGS: Lungs are clear to auscultation bilaterally, no apparent  distress ABDOMEN: soft, nontender, no rebound or guarding GU:no cva tenderness NEURO: Pt is awake/alert, moves all extremitiesx4 EXTREMITIES: pulses normal, full ROM SKIN: warm, color normal PSYCH: no abnormalities of mood noted   ED Course  Procedures (including critical care time) DIAGNOSTIC STUDIES: Oxygen Saturation is 100% on Ra, normal by my interpretation.    COORDINATION OF CARE: 11:16 PM- Patient informed of current plan for treatment and evaluation and agrees with plan at this time.   EKG Interpretation Date/Time:  Sunday May 30 2014 22:12:48 EDT Ventricular Rate:  57 PR Interval:  148 QRS Duration: 80 QT Interval:  394 QTC Calculation: 383 R Axis:   77 Text Interpretation:  Sinus bradycardia Otherwise normal ECG No  significant change was found Confirmed by Florina Ou  MD, Jenny Reichmann ([id number]) on  05/30/2014 10:54:44 PM      MDM   Nursing notes and vitals signs, including pulse oximetry, reviewed.  Summary of this visit's results, reviewed by myself:  Labs:  Results for orders placed during the hospital encounter of 05/30/14 (from the past 24 hour(s))  URINALYSIS, ROUTINE W REFLEX MICROSCOPIC     Status: Abnormal   Collection Time    05/30/14 10:15 PM      Result Value Ref Range   Color, Urine YELLOW  YELLOW   APPearance CLEAR  CLEAR   Specific Gravity, Urine 1.023  1.005 - 1.030   pH 6.5  5.0 - 8.0   Glucose, UA NEGATIVE  NEGATIVE mg/dL   Hgb urine dipstick NEGATIVE  NEGATIVE   Bilirubin Urine NEGATIVE  NEGATIVE   Ketones, ur NEGATIVE  NEGATIVE mg/dL   Protein, ur NEGATIVE  NEGATIVE mg/dL   Urobilinogen, UA 2.0 (*) 0.0 - 1.0 mg/dL   Nitrite NEGATIVE  NEGATIVE   Leukocytes, UA NEGATIVE  NEGATIVE  CBG MONITORING, ED     Status: None   Collection Time    05/30/14 10:15 PM      Result Value Ref Range   Glucose-Capillary 72  70 - 99 mg/dL  CBC WITH DIFFERENTIAL     Status: Abnormal   Collection Time    05/30/14 11:15 PM      Result Value Ref Range   WBC  3.4 (*) 4.0 - 10.5 K/uL   RBC 4.24  3.87 - 5.11 MIL/uL   Hemoglobin 11.7 (*) 12.0 - 15.0 g/dL   HCT 36.3  36.0 - 46.0 %   MCV 85.6  78.0 - 100.0 fL   MCH 27.6  26.0 - 34.0 pg   MCHC 32.2  30.0 - 36.0 g/dL   RDW 13.8  11.5 - 15.5 %   Platelets 242  150 - 400 K/uL   Neutrophils Relative % 49  43 - 77 %   Lymphocytes Relative 37  12 - 46 %   Monocytes Relative 13 (*) 3 - 12 %   Eosinophils Relative 1  0 - 5 %   Basophils Relative 0  0 - 1 %   Neutro Abs 1.7  1.7 - 7.7 K/uL   Lymphs Abs 1.3  0.7 - 4.0 K/uL   Monocytes Absolute 0.4  0.1 - 1.0 K/uL   Eosinophils Absolute 0.0  0.0 - 0.7 K/uL   Basophils Absolute 0.0  0.0 - 0.1 K/uL   RBC Morphology ELLIPTOCYTES    COMPREHENSIVE METABOLIC PANEL     Status: Abnormal   Collection Time    05/30/14 11:15 PM      Result Value Ref Range   Sodium 139  137 - 147 mEq/L   Potassium 3.7  3.7 - 5.3 mEq/L   Chloride 102  96 - 112 mEq/L   CO2 24  19 - 32 mEq/L   Glucose, Bld 90  70 - 99 mg/dL   BUN 10  6 - 23 mg/dL   Creatinine, Ser 0.70  0.50 - 1.10 mg/dL   Calcium 9.4  8.4 - 10.5 mg/dL   Total Protein 8.7 (*) 6.0 - 8.3 g/dL   Albumin 3.8  3.5 - 5.2 g/dL   AST 14  0 - 37 U/L   ALT 6  0 - 35 U/L   Alkaline Phosphatase 48  39 - 117 U/L   Total Bilirubin 0.3  0.3 - 1.2 mg/dL   GFR calc non Af Amer >90  >90 mL/min   GFR calc Af Amer >90  >90 mL/min   Anion gap 13  5 - 15  TROPONIN I     Status: None   Collection Time    05/30/14 11:15 PM      Result Value Ref Range   Troponin I <0.30  <0.30 ng/mL    Imaging Studies: Dg Chest 2 View  05/30/2014   CLINICAL DATA:  Dizziness  EXAM: CHEST  2 VIEW  COMPARISON:  02/02/2014  FINDINGS: Lungs are clear.  No pleural effusion or pneumothorax.  The heart is normal in size.  Visualized osseous structures are within normal limits.  IMPRESSION: Normal chest radiographs.   Electronically Signed   By: Julian Hy M.D.   On: 05/30/2014 22:24   12:27 AM Patient has had heart rate in the low 60s and  upper 50s. Patient advised of unremarkable laboratory findings apart from mild leukopenia. She has an appointment with it "blood specialist" scheduled for the 14th of this month. She was advised to followup as scheduled. We will also refer her to cardiology for evaluation of her sinus bradycardia.  I personally performed the services described in this documentation, which was scribed in my presence. The recorded information has been reviewed and is accurate.   Wynetta Fines, MD 05/31/14 0030

## 2014-05-31 LAB — TSH: TSH: 1.37 u[IU]/mL (ref 0.350–4.500)

## 2014-07-03 ENCOUNTER — Encounter (HOSPITAL_BASED_OUTPATIENT_CLINIC_OR_DEPARTMENT_OTHER): Payer: Self-pay | Admitting: Emergency Medicine

## 2014-07-03 ENCOUNTER — Emergency Department (HOSPITAL_BASED_OUTPATIENT_CLINIC_OR_DEPARTMENT_OTHER)
Admission: EM | Admit: 2014-07-03 | Discharge: 2014-07-03 | Disposition: A | Discharge status: Home / Self Care | Payer: BC Managed Care – PPO | Attending: Emergency Medicine | Admitting: Emergency Medicine

## 2014-07-03 DIAGNOSIS — Z792 Long term (current) use of antibiotics: Secondary | ICD-10-CM | POA: Insufficient documentation

## 2014-07-03 DIAGNOSIS — H6122 Impacted cerumen, left ear: Secondary | ICD-10-CM | POA: Insufficient documentation

## 2014-07-03 DIAGNOSIS — Z79899 Other long term (current) drug therapy: Secondary | ICD-10-CM | POA: Insufficient documentation

## 2014-07-03 DIAGNOSIS — Z791 Long term (current) use of non-steroidal anti-inflammatories (NSAID): Secondary | ICD-10-CM | POA: Insufficient documentation

## 2014-07-03 DIAGNOSIS — Z87442 Personal history of urinary calculi: Secondary | ICD-10-CM | POA: Insufficient documentation

## 2014-07-03 MED ORDER — DOCUSATE SODIUM 100 MG PO CAPS: 100.0000 mg | ORAL_CAPSULE | Freq: Once | ORAL | Status: DC

## 2014-07-03 MED ORDER — DOCUSATE SODIUM 50 MG/5ML PO LIQD
ORAL | Status: AC
  Administered 2014-07-03: 100 mg
  Filled 2014-07-03: qty 10

## 2014-07-03 NOTE — ED Provider Notes (Signed)
CSN: [id number]     Arrival date & time 07/03/14  0806 History  This chart was scribed for Christina Essex, MD by Ludger Nutting, ED Scribe. This patient was seen in room MH02/MH02 and the patient's care was started 8:23 AM.    Chief Complaint  Patient presents with  . Otalgia    The history is provided by the patient. No language interpreter was used.    HPI Comments: Christina Holmes is a 43 y.o. female who presents to the Emergency Department complaining of 4 days of gradual onset, constant, gradually worsened left sided otalgia with associated tinnitus and decreased hearing. Patient states she applied unspecified ear drops which relieved her pain but caused her to have the decreased hearing. She has also applied hydrogen peroxide and inserted a q-tip and safety pin without relief. She denies ear bleeding, ear discharge, fever, sore throat, rhinorrhea, dizziness, lightheadedness,   Past Medical History  Diagnosis Date  . Kidney calculi    Past Surgical History  Procedure Laterality Date  . Abdominal hysterectomy     No family history on file. History  Substance Use Topics  . Smoking status: Never Smoker   . Smokeless tobacco: Not on file  . Alcohol Use: No   OB History   Grav Para Term Preterm Abortions TAB SAB Ect Mult Living                 Review of Systems  A complete 10 system review of systems was obtained and all systems are negative except as noted in the HPI and PMH.    Allergies  Review of patient's allergies indicates no known allergies.  Home Medications   Prior to Admission medications   Medication Sig Start Date End Date Taking? Authorizing Provider  ciprofloxacin (CIPRO) 500 MG tablet Take 1 tablet (500 mg total) by mouth 2 (two) times daily. 09/02/12   Mylinda Latina, MD  HYDROcodone-acetaminophen (NORCO) 5-325 MG per tablet Take 1 tablet by mouth every 4 (four) hours as needed. 12/20/13   Ephraim Hamburger, MD  ibuprofen (ADVIL,MOTRIN) 800 MG tablet Take 1  tablet (800 mg total) by mouth 3 (three) times daily. 07/13/13   Hannah Muthersbaugh, PA-C  ibuprofen (ADVIL,MOTRIN) 800 MG tablet Take 1 tablet (800 mg total) by mouth every 8 (eight) hours as needed for moderate pain. 12/20/13   Ephraim Hamburger, MD  ibuprofen (ADVIL,MOTRIN) 800 MG tablet Take 1 tablet (800 mg total) by mouth 3 (three) times daily. 05/08/14   Robyn M Hess, PA-C  metroNIDAZOLE (FLAGYL) 500 MG tablet Take 1 tablet (500 mg total) by mouth 2 (two) times daily. 09/02/12   Mylinda Latina, MD  metroNIDAZOLE (FLAGYL) 500 MG tablet Take 1 tablet (500 mg total) by mouth 2 (two) times daily. One po bid x 7 days 07/13/13   Jarrett Soho Muthersbaugh, PA-C  metroNIDAZOLE (FLAGYL) 500 MG tablet Take 1 tablet (500 mg total) by mouth 2 (two) times daily. One po bid x 7 days 12/20/13   Ephraim Hamburger, MD  ondansetron (ZOFRAN) 4 MG tablet Take 1 tablet (4 mg total) by mouth every 6 (six) hours as needed for nausea or vomiting. 12/20/13   Ephraim Hamburger, MD  polyethylene glycol powder (GLYCOLAX/MIRALAX) powder Take 17 g by mouth 2 (two) times daily. Until daily soft stools  OTC 07/13/13   Hannah Muthersbaugh, PA-C  potassium chloride (K-DUR) 10 MEQ tablet Take 1 tablet (10 mEq total) by mouth daily. 05/24/14   Dot Lanes, MD  BP 115/78  Pulse 73  Temp(Src) 98.9 F (37.2 C) (Oral)  Resp 20  Ht 5\' 4"  (1.626 m)  Wt 150 lb (68.04 kg)  BMI 25.73 kg/m2  SpO2 100%  LMP 04/05/2011 Physical Exam  Nursing note and vitals reviewed. Constitutional: She is oriented to person, place, and time. She appears well-developed and well-nourished.  HENT:  Head: Normocephalic and atraumatic.  Right Ear: Tympanic membrane, external ear and ear canal normal.  Nose: Nose normal.  Mouth/Throat: Oropharynx is clear and moist. No oropharyngeal exudate.  Cerumen impaction on the left. No tragus pain or mastoid pain. Right TM is normal.   Neck: Normal range of motion. Neck supple. No tracheal deviation present.   Cardiovascular: Normal rate, regular rhythm and normal heart sounds.   Pulmonary/Chest: Effort normal and breath sounds normal. No respiratory distress. She has no wheezes. She has no rales.  Abdominal: She exhibits no distension.  Musculoskeletal: Normal range of motion.  Lymphadenopathy:    She has no cervical adenopathy.  Neurological: She is alert and oriented to person, place, and time.  Skin: Skin is warm and dry.  Psychiatric: She has a normal mood and affect.    ED Course  EAR CERUMEN REMOVAL Date/Time: 07/03/2014 9:07 AM Performed by: Christina Holmes Authorized by: Christina Holmes Consent: Verbal consent obtained. Risks and benefits: risks, benefits and alternatives were discussed Consent given by: patient Patient understanding: patient states understanding of the procedure being performed Patient consent: the patient's understanding of the procedure matches consent given Procedure consent: procedure consent matches procedure scheduled Relevant documents: relevant documents present and verified Test results: test results available and properly labeled Site marked: the operative site was marked Patient identity confirmed: verbally with patient and provided demographic data Time out: Immediately prior to procedure a "time out" was called to verify the correct patient, procedure, equipment, support staff and site/side marked as required. Local anesthetic: none Ceruminolytics applied: Ceruminolytics applied prior to the procedure. Location details: left ear Procedure type: irrigation Patient sedated: no Patient tolerance: Patient tolerated the procedure well with no immediate complications.   (including critical care time)  DIAGNOSTIC STUDIES: Oxygen Saturation is 100% on RA, normal by my interpretation.    COORDINATION OF CARE: 8:26 AM Will remove ear wax. Discussed treatment plan with pt at bedside and pt agreed to plan.  8:55 AM Ear wax removed from left ear,  left TM visualized with no perforation.    Labs Review Labs Reviewed - No data to display  Imaging Review No results found.   EKG Interpretation None      MDM   Final diagnoses:  Cerumen impaction, left   Ear pain with cerumen impaction.  No bleeding or drainage.  Ear wax removed by irrigation by nursing.  TM normal on recheck.  No erythema or perforation.   I personally performed the services described in this documentation, which was scribed in my presence. The recorded information has been reviewed and is accurate.   Christina Essex, MD 07/03/14 [phone]

## 2014-07-03 NOTE — Discharge Instructions (Signed)
Cerumen Impaction °A cerumen impaction is when the wax in your ear forms a plug. This plug usually causes reduced hearing. Sometimes it also causes an earache or dizziness. Removing a cerumen impaction can be difficult and painful. The wax sticks to the ear canal. The canal is sensitive and bleeds easily. If you try to remove a heavy wax buildup with a cotton tipped swab, you may push it in further. °Irrigation with water, suction, and small ear curettes may be used to clear out the wax. If the impaction is fixed to the skin in the ear canal, ear drops may be needed for a few days to loosen the wax. People who build up a lot of wax frequently can use ear wax removal products available in your local drugstore. °SEEK MEDICAL CARE IF:  °You develop an earache, increased hearing loss, or marked dizziness. °Document Released: 10/18/2004 Document Revised: 12/03/2011 Document Reviewed: 12/08/2009 °[employer]® Patient Information ©2015 [employer]. This information is not intended to replace advice given to you by your health care provider. Make sure you discuss any questions you have with your health care provider. ° °

## 2014-07-03 NOTE — ED Notes (Signed)
Pt c/o LT ear pain since Monday

## 2014-11-08 ENCOUNTER — Encounter (HOSPITAL_BASED_OUTPATIENT_CLINIC_OR_DEPARTMENT_OTHER): Payer: Self-pay | Admitting: *Deleted

## 2014-11-08 ENCOUNTER — Emergency Department (HOSPITAL_BASED_OUTPATIENT_CLINIC_OR_DEPARTMENT_OTHER)
Admission: EM | Admit: 2014-11-08 | Discharge: 2014-11-08 | Disposition: A | Discharge status: Home / Self Care | Payer: 59 | Attending: Emergency Medicine | Admitting: Emergency Medicine

## 2014-11-08 DIAGNOSIS — Z79899 Other long term (current) drug therapy: Secondary | ICD-10-CM | POA: Diagnosis not present

## 2014-11-08 DIAGNOSIS — H6093 Unspecified otitis externa, bilateral: Secondary | ICD-10-CM | POA: Insufficient documentation

## 2014-11-08 DIAGNOSIS — Z792 Long term (current) use of antibiotics: Secondary | ICD-10-CM | POA: Diagnosis not present

## 2014-11-08 DIAGNOSIS — Z791 Long term (current) use of non-steroidal anti-inflammatories (NSAID): Secondary | ICD-10-CM | POA: Diagnosis not present

## 2014-11-08 DIAGNOSIS — Z87442 Personal history of urinary calculi: Secondary | ICD-10-CM | POA: Insufficient documentation

## 2014-11-08 DIAGNOSIS — H9203 Otalgia, bilateral: Secondary | ICD-10-CM | POA: Diagnosis present

## 2014-11-08 MED ORDER — ANTIPYRINE-BENZOCAINE 5.4-1.4 % OT SOLN: 3.0000 [drp] | OTIC | Status: DC | PRN

## 2014-11-08 MED ORDER — NEOMYCIN-COLIST-HC-THONZONIUM 3.3-3-10-0.5 MG/ML OT SUSP: 3.0000 [drp] | Freq: Once | OTIC | Status: DC

## 2014-11-08 MED ORDER — CIPROFLOXACIN-DEXAMETHASONE 0.3-0.1 % OT SUSP: 4.0000 [drp] | Freq: Two times a day (BID) | OTIC | Status: DC

## 2014-11-08 MED ORDER — NEOMYCIN-POLYMYXIN-HC 3.5-10000-1 OT SUSP: 4.0000 [drp] | Freq: Three times a day (TID) | OTIC | Status: DC

## 2014-11-08 NOTE — Discharge Instructions (Signed)
Draining Ear Ear wax, pus, blood and other fluids are examples of the different types of drainage from ears. Drops or cream may be needed to lessen the itching which may occur with ear drainage. CAUSES   Skin irritations in the ear.  Ear infection.  Swimmer's ear.  Ruptured eardrum.  Foreign object in the ear canal.  Sudden pressure changes.  Head injury. HOME CARE INSTRUCTIONS   Only take over-the-counter or prescription medicines for pain, fever, or discomfort as directed by your caregiver.  Do not rub the ear canal with cotton-tipped swabs.  Do not swim until your caregiver says it is okay.  Before you take a shower, cover a cotton ball with petroleum jelly to keep water out.  Limit exposure to smoke. Secondhand smoke can increase the chance for ear infections.  Keep up with immunizations.  Wash your hands well.  Keep all follow-up appointments to examine the ear and evaluate hearing. SEEK MEDICAL CARE IF:   You have increased drainage.  You have ear pain, a fever, or drainage that is not getting better after 48 hours of antibiotics.  You are unusually tired. SEEK IMMEDIATE MEDICAL CARE IF:  You have severe ear pain or headache.  The patient is older than 3 months with a rectal or oral temperature of 102 F (38.9 C) or higher.  The patient is 33 months old or younger with a rectal temperature of 100.4 F (38 C) or higher.  You vomit.  You feel dizzy.  You have a seizure.  You have new hearing loss. MAKE SURE YOU:   Understand these instructions.  Will watch your condition.  Will get help right away if you are not doing well or get worse. Document Released: 09/10/2005 Document Revised: 12/03/2011 Document Reviewed: 07/14/2009 [facility] Patient Information 2015 [place], [place]. This information is not intended to replace advice given to you by your health care provider. Make sure you discuss any questions you have with your health care  provider.  Otitis Externa Otitis externa is a bacterial or fungal infection of the outer ear canal. This is the area from the eardrum to the outside of the ear. Otitis externa is sometimes called "swimmer's ear." CAUSES  Possible causes of infection include:  Swimming in dirty water.  Moisture remaining in the ear after swimming or bathing.  Mild injury (trauma) to the ear.  Objects stuck in the ear (foreign body).  Cuts or scrapes (abrasions) on the outside of the ear. SIGNS AND SYMPTOMS  The first symptom of infection is often itching in the ear canal. Later signs and symptoms may include swelling and redness of the ear canal, ear pain, and yellowish-white fluid (pus) coming from the ear. The ear pain may be worse when pulling on the earlobe. DIAGNOSIS  Your health care provider will perform a physical exam. A sample of fluid may be taken from the ear and examined for bacteria or fungi. TREATMENT  Antibiotic ear drops are often given for 10 to 14 days. Treatment may also include pain medicine or corticosteroids to reduce itching and swelling. HOME CARE INSTRUCTIONS   Apply antibiotic ear drops to the ear canal as prescribed by your health care provider.  Take medicines only as directed by your health care provider.  If you have diabetes, follow any additional treatment instructions from your health care provider.  Keep all follow-up visits as directed by your health care provider. PREVENTION   Keep your ear dry. Use the corner of a towel to absorb  water out of the ear canal after swimming or bathing.  Avoid scratching or putting objects inside your ear. This can damage the ear canal or remove the protective wax that lines the canal. This makes it easier for bacteria and fungi to grow.  Avoid swimming in lakes, polluted water, or poorly chlorinated pools.  You may use ear drops made of rubbing alcohol and vinegar after swimming. Combine equal parts of white vinegar and alcohol  in a bottle. Put 3 or 4 drops into each ear after swimming. SEEK MEDICAL CARE IF:   You have a fever.  Your ear is still red, swollen, painful, or draining pus after 3 days.  Your redness, swelling, or pain gets worse.  You have a severe headache.  You have redness, swelling, pain, or tenderness in the area behind your ear. MAKE SURE YOU:   Understand these instructions.  Will watch your condition.  Will get help right away if you are not doing well or get worse. Document Released: 09/10/2005 Document Revised: 01/25/2014 Document Reviewed: 09/27/2011 [facility] Patient Information 2015 [place], [place]. This information is not intended to replace advice given to you by your health care provider. Make sure you discuss any questions you have with your health care provider.

## 2014-11-08 NOTE — ED Provider Notes (Signed)
CSN: [id number]     Arrival date & time 11/08/14  1736 History   First MD Initiated Contact with Patient 11/08/14 1737     Chief Complaint  Patient presents with  . Otalgia     (Consider location/radiation/quality/duration/timing/severity/associated sxs/prior Treatment) HPI   PCP: No PCP Per Patient Blood pressure 117/92, pulse 69, temperature 98.1 F (36.7 C), temperature source Oral, resp. rate 20, height 5\' 4"  (1.626 m), weight 150 lb (68.04 kg), last menstrual period 04/05/2011, SpO2 100 %.  Christina Holmes is a 44 y.o.female without any significant PMH presents to the ER with complaints of bilateral ear pain. 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 She noticed she had brown fluid draining from her ears a few days ago. She was trying to clean it out with que tips and then tried using a pen tip. She accidentally poked herself too hard and caused her ear to bleed. She continues to have decreased hearing in both ears. She denies fevers, neck pain, headache, back pain, nausea, vomiting, diarrhea, abdominal pains.   Past Medical History  Diagnosis Date  . Kidney calculi    Past Surgical History  Procedure Laterality Date  . Abdominal hysterectomy     No family history on file. History  Substance Use Topics  . Smoking status: Never Smoker   . Smokeless tobacco: Not on file  . Alcohol Use: No   OB History    No data available     Review of Systems  10 Systems reviewed and are negative for acute change except as noted in the HPI.    Allergies  Review of patient's allergies indicates no known allergies.  Home Medications   Prior to Admission medications   Medication Sig Start Date End Date Taking? Authorizing Provider  antipyrine-benzocaine Toniann Fail) otic solution Place 3-4 drops into both ears every 2 (two) hours as needed for ear pain. 11/08/14   Jarrett Chicoine Marilu Favre, PA-C  ciprofloxacin (CIPRO)  500 MG tablet Take 1 tablet (500 mg total) by mouth 2 (two) times daily. 09/02/12   Mylinda Latina, MD  HYDROcodone-acetaminophen (NORCO) 5-325 MG per tablet Take 1 tablet by mouth every 4 (four) hours as needed. 12/20/13   Ephraim Hamburger, MD  ibuprofen (ADVIL,MOTRIN) 800 MG tablet Take 1 tablet (800 mg total) by mouth 3 (three) times daily. 07/13/13   Hannah Muthersbaugh, PA-C  ibuprofen (ADVIL,MOTRIN) 800 MG tablet Take 1 tablet (800 mg total) by mouth every 8 (eight) hours as needed for moderate pain. 12/20/13   Ephraim Hamburger, MD  ibuprofen (ADVIL,MOTRIN) 800 MG tablet Take 1 tablet (800 mg total) by mouth 3 (three) times daily. 05/08/14   Robyn M Hess, PA-C  metroNIDAZOLE (FLAGYL) 500 MG tablet Take 1 tablet (500 mg total) by mouth 2 (two) times daily. 09/02/12   Mylinda Latina, MD  metroNIDAZOLE (FLAGYL) 500 MG tablet Take 1 tablet (500 mg total) by mouth 2 (two) times daily. One po bid x 7 days 07/13/13   Jarrett Soho Muthersbaugh, PA-C  metroNIDAZOLE (FLAGYL) 500 MG tablet Take 1 tablet (500 mg total) by mouth 2 (two) times daily. One po bid x 7 days 12/20/13   Ephraim Hamburger, MD  neomycin-polymyxin-hydrocortisone (CORTISPORIN) 3.5-10000-1 otic suspension Place 4 drops into both ears 3 (three) times daily. 11/08/14   Kizzi Overbey Marilu Favre, PA-C  ondansetron (ZOFRAN) 4 MG tablet Take 1 tablet (4 mg total) by mouth every 6 (six) hours  as needed for nausea or vomiting. 12/20/13   Ephraim Hamburger, MD  polyethylene glycol powder (GLYCOLAX/MIRALAX) powder Take 17 g by mouth 2 (two) times daily. Until daily soft stools  OTC 07/13/13   Hannah Muthersbaugh, PA-C  potassium chloride (K-DUR) 10 MEQ tablet Take 1 tablet (10 mEq total) by mouth daily. 05/24/14   Dot Lanes, MD   BP 117/92 mmHg  Pulse 69  Temp(Src) 98.1 F (36.7 C) (Oral)  Resp 20  Ht 5\' 4"  (1.626 m)  Wt 150 lb (68.04 kg)  BMI 25.73 kg/m2  SpO2 100%  LMP 04/05/2011 Physical Exam  Constitutional: She appears well-developed and  well-nourished. No distress.  HENT:  Head: Normocephalic and atraumatic.  Right Ear: There is drainage, swelling and tenderness. No foreign bodies. No mastoid tenderness. Tympanic membrane is not perforated. No hemotympanum. Decreased hearing is noted.  Left Ear: There is drainage, swelling and tenderness. No foreign bodies. No mastoid tenderness. Tympanic membrane is not perforated. No hemotympanum. Decreased hearing is noted.  Eyes: Pupils are equal, round, and reactive to light.  Neck: Normal range of motion. Neck supple.  Cardiovascular: Normal rate and regular rhythm.   Pulmonary/Chest: Effort normal.  Abdominal: Soft.  Neurological: She is alert.  Skin: Skin is warm and dry.  Nursing note and vitals reviewed.   ED Course  Procedures (including critical care time) Labs Review Labs Reviewed - No data to display  Imaging Review No results found.   EKG Interpretation None      MDM   Final diagnoses:  Otitis externa, bilateral    antipyrine-benzocaine (AURALGAN) otic solution Place 3-4 drops into both ears every 2 (two) hours as needed for ear pain. 10 mL Linus Mako, PA-C   neomycin-polymyxin-hydrocortisone (CORTISPORIN) 3.5-10000-1 otic suspension Place 4 drops into both ears 3 (three) times daily. 10 mL Linus Mako, PA-C   Pt with bilateral otitis externa, most likely from prolonged cold exposure. She currently does not have any neck pain, fevers, or mastoid tenderness. Will try treatment with Otic drops. Referred to ENT if hearing not improving or drainage continues.  44 y.o.Christina Holmes's evaluation in the Emergency Department is complete. It has been determined that no acute conditions requiring further emergency intervention are present at this time. The patient/guardian have been advised of the diagnosis and plan. We have discussed signs and symptoms that warrant return to the ED, such as changes or worsening in symptoms.  Vital signs are stable at  discharge. Filed Vitals:   11/08/14 1748  BP: 117/92  Pulse: 69  Temp: 98.1 F (36.7 C)  Resp: 20    Patient/guardian has voiced understanding and agreed to follow-up with the PCP or specialist.      Linus Mako, PA-C 11/08/14 1833  Veryl Speak, MD 11/08/14 1954

## 2014-11-08 NOTE — ED Notes (Signed)
Pain in both ears x 3 days. States she has had blood from her left ear today.

## 2014-11-14 ENCOUNTER — Emergency Department (HOSPITAL_BASED_OUTPATIENT_CLINIC_OR_DEPARTMENT_OTHER)
Admission: EM | Admit: 2014-11-14 | Discharge: 2014-11-14 | Disposition: A | Discharge status: Home / Self Care | Payer: 59 | Attending: Emergency Medicine | Admitting: Emergency Medicine

## 2014-11-14 ENCOUNTER — Encounter (HOSPITAL_BASED_OUTPATIENT_CLINIC_OR_DEPARTMENT_OTHER): Payer: Self-pay

## 2014-11-14 DIAGNOSIS — H938X2 Other specified disorders of left ear: Secondary | ICD-10-CM | POA: Diagnosis present

## 2014-11-14 DIAGNOSIS — Z87442 Personal history of urinary calculi: Secondary | ICD-10-CM | POA: Insufficient documentation

## 2014-11-14 DIAGNOSIS — H6523 Chronic serous otitis media, bilateral: Secondary | ICD-10-CM

## 2014-11-14 DIAGNOSIS — H65493 Other chronic nonsuppurative otitis media, bilateral: Secondary | ICD-10-CM | POA: Diagnosis not present

## 2014-11-14 DIAGNOSIS — H6983 Other specified disorders of Eustachian tube, bilateral: Secondary | ICD-10-CM | POA: Diagnosis not present

## 2014-11-14 MED ORDER — LORATADINE 10 MG PO TABS: 10.0000 mg | ORAL_TABLET | Freq: Every day | ORAL | Status: DC

## 2014-11-14 MED ORDER — FLUTICASONE PROPIONATE 50 MCG/ACT NA SUSP: 2.0000 | Freq: Every day | NASAL | Status: DC

## 2014-11-14 NOTE — ED Notes (Signed)
Patient here with left ear fullness for past few days, thinks she has wax causing the discomfort

## 2014-11-14 NOTE — ED Provider Notes (Signed)
CSN: [id number]     Arrival date & time 11/14/14  1101 History   First MD Initiated Contact with Patient 11/14/14 1205     Chief Complaint  Patient presents with  . Ear Fullness     (Consider location/radiation/quality/duration/timing/severity/associated sxs/prior Treatment) HPI Comments: Christina Holmes is a 44 y.o. female with no significant PMHx, who presents to the ED with complaints of left ear fullness 7 days. She was given medications for otitis externa last week, finished the course but continues to have fullness in the left ear, stating that she feels like there is wax. Report subjective hearing deficit in the left ear. Denies any drainage or pain, tinnitus, fevers, URI symptoms, or vertigo. Denies any headache, nausea, vomiting, or weakness. States she has been using Q-tips and feels that she is not getting all the earwax out. No known seasonal allergies, doesn't take antihistamines. Hasn't tried anything aside from Q-tips for her symptoms  Patient is a 44 y.o. female presenting with plugged ear sensation. The history is provided by the patient. No language interpreter was used.  Ear Fullness This is a recurrent problem. The current episode started in the past 7 days. The problem occurs constantly. The problem has been unchanged. Pertinent negatives include no chest pain, chills, congestion, coughing, fever, headaches, nausea, neck pain, rash, sore throat, swollen glands, vertigo, visual change, vomiting or weakness. Nothing aggravates the symptoms. She has tried nothing for the symptoms. The treatment provided no relief.    Past Medical History  Diagnosis Date  . Kidney calculi    Past Surgical History  Procedure Laterality Date  . Abdominal hysterectomy     No family history on file. History  Substance Use Topics  . Smoking status: Never Smoker   . Smokeless tobacco: Not on file  . Alcohol Use: No   OB History    No data available     Review of Systems   Constitutional: Negative for fever and chills.  HENT: Positive for hearing loss (in left ear). Negative for congestion, ear discharge, ear pain, rhinorrhea, sinus pressure, sore throat and tinnitus.        +L ear fullness  Eyes: Negative for pain, discharge, redness and itching.  Respiratory: Negative for cough and shortness of breath.   Cardiovascular: Negative for chest pain.  Gastrointestinal: Negative for nausea and vomiting.  Musculoskeletal: Negative for neck pain.  Skin: Negative for rash.  Neurological: Negative for dizziness, vertigo, weakness, light-headedness and headaches.   10 Systems reviewed and are negative for acute change except as noted in the HPI.    Allergies  Review of patient's allergies indicates no known allergies.  Home Medications   Prior to Admission medications   Not on File   BP 113/64 mmHg  Pulse 72  Temp(Src) 98.5 F (36.9 C) (Oral)  Resp 18  Ht 5\' 4"  (1.626 m)  Wt 158 lb (71.668 kg)  BMI 27.11 kg/m2  SpO2 100%  LMP 04/05/2011 Physical Exam  Constitutional: She is oriented to person, place, and time. Vital signs are normal. She appears well-developed and well-nourished.  Non-toxic appearance. No distress.  Afebrile nontoxic NAD  HENT:  Head: Normocephalic and atraumatic.  Right Ear: Hearing, external ear and ear canal normal. No swelling or tenderness. Tympanic membrane is not erythematous and not bulging. A middle ear effusion (serous) is present.  Left Ear: Hearing, external ear and ear canal normal. No swelling or tenderness. Tympanic membrane is not erythematous and not bulging. A middle ear effusion (serous)  is present.  Nose: Mucosal edema present.  Mouth/Throat: Mucous membranes are normal.  B/l TMs with serous effusion, no bulging or erythema. Canals free of erythema or edema. Very mild amount of soft wax in canal.  Nasal turbinates edematous with slight cobblestoning  Eyes: Conjunctivae and EOM are normal. Right eye exhibits no  discharge. Left eye exhibits no discharge.  Neck: Normal range of motion. Neck supple.  Cardiovascular: Normal rate.   Pulmonary/Chest: Effort normal. No respiratory distress.  Abdominal: Normal appearance. She exhibits no distension.  Musculoskeletal: Normal range of motion.  Neurological: She is alert and oriented to person, place, and time. She has normal strength. No sensory deficit.  Skin: Skin is warm, dry and intact. No rash noted.  Psychiatric: She has a normal mood and affect. Her behavior is normal.  Nursing note and vitals reviewed.   ED Course  Procedures (including critical care time) Labs Review Labs Reviewed - No data to display  Imaging Review No results found.   EKG Interpretation None      MDM   Final diagnoses:  Ear fullness, left  Eustachian tube dysfunction, bilateral  Bilateral chronic serous otitis media    44 y.o. female with ear fullness x7 days. Recently treated for AOE. Canals without edema or erythema, very minimal wax. TMs with slight serous effusion, no AOM appearance. Some nasal turbinate edema. Likely eustachian tube dysfunction. Ear canals with very minimal wax. Pt requested wax to be removed, used curette but pt wanted flush. Nursing flushed ears. Will give flonase and claritin for suspected eustachian tube dysfunction. Pt requesting ENT referral which I think is reasonable. I explained the diagnosis and have given explicit precautions to return to the ER including for any other new or worsening symptoms. The patient understands and accepts the medical plan as it's been dictated and I have answered their questions. Discharge instructions concerning home care and prescriptions have been given. The patient is STABLE and is discharged to home in good condition.  BP 113/64 mmHg  Pulse 72  Temp(Src) 98.5 F (36.9 C) (Oral)  Resp 18  Ht 5\' 4"  (1.626 m)  Wt 158 lb (71.668 kg)  BMI 27.11 kg/m2  SpO2 100%  LMP 04/05/2011  Meds ordered this  encounter  Medications  . fluticasone (FLONASE) 50 MCG/ACT nasal spray    Sig: Place 2 sprays into both nostrils daily.    Dispense:  16 g    Refill:  0    Order Specific Question:  Supervising Provider    Answer:  Noemi Chapel D [[id number]]  . loratadine (CLARITIN) 10 MG tablet    Sig: Take 1 tablet (10 mg total) by mouth daily.    Dispense:  30 tablet    Refill:  0    Order Specific Question:  Supervising Provider    Answer:  Johnna Acosta [id number] [place], PA-C 11/14/14 1313  Carmin Muskrat, MD 11/14/14 [phone]

## 2014-11-14 NOTE — ED Notes (Signed)
Flushed both ears, wax chunks came out of right ear and small wax particles came out of R ear

## 2014-11-14 NOTE — Discharge Instructions (Signed)
Use claritin daily to help with symptoms. Use flonase daily. Follow up with ear nose and throat specialist to have ongoing management of your chronic ear fullness. Return to the ER for changes or worsening symptoms.   Otitis Media With Effusion Otitis media with effusion is the presence of fluid in the middle ear. This is a common problem in children, which often follows ear infections. It may be present for weeks or longer after the infection. Unlike an acute ear infection, otitis media with effusion refers only to fluid behind the ear drum and not infection. Children with repeated ear and sinus infections and allergy problems are the most likely to get otitis media with effusion. CAUSES  The most frequent cause of the fluid buildup is dysfunction of the eustachian tubes. These are the tubes that drain fluid in the ears to the back of the nose (nasopharynx). SYMPTOMS   The main symptom of this condition is hearing loss. As a result, you or your child may:  Listen to the TV at a loud volume.  Not respond to questions.  Ask "what" often when spoken to.  Mistake or confuse one sound or word for another.  There may be a sensation of fullness or pressure but usually not pain. DIAGNOSIS   Your health care provider will diagnose this condition by examining you or your child's ears.  Your health care provider may test the pressure in you or your child's ear with a tympanometer.  A hearing test may be conducted if the problem persists. TREATMENT   Treatment depends on the duration and the effects of the effusion.  Antibiotics, decongestants, nose drops, and cortisone-type drugs (tablets or nasal spray) may not be helpful.  Children with persistent ear effusions may have delayed language or behavioral problems. Children at risk for developmental delays in hearing, learning, and speech may require referral to a specialist earlier than children not at risk.  You or your child's health care  provider may suggest a referral to an ear, nose, and throat surgeon for treatment. The following may help restore normal hearing:  Drainage of fluid.  Placement of ear tubes (tympanostomy tubes).  Removal of adenoids (adenoidectomy). HOME CARE INSTRUCTIONS   Avoid secondhand smoke.  Infants who are breastfed are less likely to have this condition.  Avoid feeding infants while they are lying flat.  Avoid known environmental allergens.  Avoid people who are sick. SEEK MEDICAL CARE IF:   Hearing is not better in 3 months.  Hearing is worse.  Ear pain.  Drainage from the ear.  Dizziness. MAKE SURE YOU:   Understand these instructions.  Will watch your condition.  Will get help right away if you are not doing well or get worse. Document Released: 10/18/2004 Document Revised: 01/25/2014 Document Reviewed: 04/07/2013 [facility] Patient Information 2015 [place], [place]. This information is not intended to replace advice given to you by your health care provider. Make sure you discuss any questions you have with your health care provider.  Pressure Equalization Tubes Pressure equalizing tubes (PE tubes) are small tubes that are placed through a tiny surgical cut in the eardrum. PE tubes are also called tympanostomy tubes or ventilation tubes.  These tubes are usually placed because of:  Frequent middle ear infections.  Chronic fluid in the middle ear.  Hearing or speech problems due to repeated middle ear infections or fluid build up. PE tubes help prevent:  Infections  Fluid build up. It is believed tubes do this because they keep  the middle ear space full of air (ventilated).  There are two kinds of PE tubes:  Short term - these tubes usually last only 6 to 9 months. They fall out on their own.  Long term - these stay in place longer than short term tubes. Often they have to be removed by the surgeon. Most PE tubes fall out after a while into the outer ear canal. The  eardrum seals itself shut. The tube is easily removed from the ear canal by a caregiver or it falls out on its own. Children are usually given a mild, general anesthetic before surgery. This is something that puts them to sleep. Older children or adults may only need a local anesthetic. This means medicines are used to make the eardrum numb.  BEFORE THE PROCEDURE Follow the instructions given by your surgeon as to how to prepare for this surgery. LET YOUR CAREGIVER KNOW ABOUT:   Previous reactions to anesthesia.  Reactions to anesthesia by anyone in your family. RISKS AND COMPLICATIONS  There are few risks to this simple surgery. The anesthesia specialist will discuss the risks of anesthesia. Sometimes the eardrum does not heal after the tube falls out. If a hole in the eardrum persists, the hole can be repaired by minor surgery.  AFTER THE PROCEDURE  Follow your surgeon's instructions for care after surgery. Often eardrops are prescribed.  There may be fluid draining from the ear for a few days after the surgery. Fluid may also drain in the future with colds.  If hearing was decreased due to fluid build up, there should be an improvement right after the surgery. HOME CARE INSTRUCTIONS  Because the PE tube opens a tiny hole between the outer and the middle ear, water can accidentally travel into the middle ear from the outside. Your surgeon may suggest earplugs. It is best to avoid:  Dunking the head in bath water.  Diving. SEEK MEDICAL CARE IF:   Ear drainage that looks thick, smells bad or is bloody.  Decreased hearing.  Balance problems.  Ear pain. SEEK IMMEDIATE MEDICAL CARE IF:   Redness, tenderness or swelling of the ear canal or ear itself. Document Released: 03/02/2002 Document Revised: 12/03/2011 Document Reviewed: 09/30/2008 [facility] Patient Information 2015 [place], [place]. This information is not intended to replace advice given to you by your health care provider.  Make sure you discuss any questions you have with your health care provider.

## 2015-01-13 ENCOUNTER — Emergency Department (HOSPITAL_BASED_OUTPATIENT_CLINIC_OR_DEPARTMENT_OTHER)
Admission: EM | Admit: 2015-01-13 | Discharge: 2015-01-13 | Disposition: A | Discharge status: Home / Self Care | Payer: 59 | Attending: Emergency Medicine | Admitting: Emergency Medicine

## 2015-01-13 ENCOUNTER — Encounter (HOSPITAL_BASED_OUTPATIENT_CLINIC_OR_DEPARTMENT_OTHER): Payer: Self-pay | Admitting: Emergency Medicine

## 2015-01-13 DIAGNOSIS — H6503 Acute serous otitis media, bilateral: Secondary | ICD-10-CM | POA: Diagnosis not present

## 2015-01-13 DIAGNOSIS — H9203 Otalgia, bilateral: Secondary | ICD-10-CM | POA: Diagnosis present

## 2015-01-13 DIAGNOSIS — Z87442 Personal history of urinary calculi: Secondary | ICD-10-CM | POA: Diagnosis not present

## 2015-01-13 DIAGNOSIS — H6093 Unspecified otitis externa, bilateral: Secondary | ICD-10-CM | POA: Diagnosis not present

## 2015-01-13 MED ORDER — AMOXICILLIN-POT CLAVULANATE 875-125 MG PO TABS: 1.0000 | ORAL_TABLET | Freq: Two times a day (BID) | ORAL | Status: DC

## 2015-01-13 MED ORDER — CIPROFLOXACIN-DEXAMETHASONE 0.3-0.1 % OT SUSP
4.0000 [drp] | Freq: Two times a day (BID) | OTIC | Status: DC
  Administered 2015-01-13: 4 [drp] via OTIC
  Filled 2015-01-13: qty 7.5

## 2015-01-13 MED ORDER — AMOXICILLIN-POT CLAVULANATE 875-125 MG PO TABS
1.0000 | ORAL_TABLET | Freq: Once | ORAL | Status: AC
  Administered 2015-01-13: 1 via ORAL
  Filled 2015-01-13: qty 1

## 2015-01-13 NOTE — Discharge Instructions (Signed)
Otitis Externa Otitis externa is a germ infection in the outer ear. The outer ear is the area from the eardrum to the outside of the ear. Otitis externa is sometimes called "swimmer's ear." HOME CARE  Put drops in the ear as told by your doctor.  Only take medicine as told by your doctor.  If you have diabetes, your doctor may give you more directions. Follow your doctor's directions.  Keep all doctor visits as told. To avoid another infection:  Keep your ear dry. Use the corner of a towel to dry your ear after swimming or bathing.  Avoid scratching or putting things inside your ear.  Avoid swimming in lakes, dirty water, or pools that use a chemical called chlorine poorly.  You may use ear drops after swimming. Combine equal amounts of white vinegar and alcohol in a bottle. Put 3 or 4 drops in each ear. GET HELP IF:   You have a fever.  Your ear is still red, puffy (swollen), or painful after 3 days.  You still have yellowish-white fluid (pus) coming from the ear after 3 days.  Your redness, puffiness, or pain gets worse.  You have a really bad headache.  You have redness, puffiness, pain, or tenderness behind your ear. MAKE SURE YOU:   Understand these instructions.  Will watch your condition.  Will get help right away if you are not doing well or get worse. Document Released: 02/27/2008 Document Revised: 01/25/2014 Document Reviewed: 09/27/2011 [facility] Patient Information 2015 [place], [place]. This information is not intended to replace advice given to you by your health care provider. Make sure you discuss any questions you have with your health care provider.  Otitis Media With Effusion Otitis media with effusion is the presence of fluid in the middle ear. This is a common problem in children, which often follows ear infections. It may be present for weeks or longer after the infection. Unlike an acute ear infection, otitis media with effusion refers only to fluid  behind the ear drum and not infection. Children with repeated ear and sinus infections and allergy problems are the most likely to get otitis media with effusion. CAUSES  The most frequent cause of the fluid buildup is dysfunction of the eustachian tubes. These are the tubes that drain fluid in the ears to the back of the nose (nasopharynx). SYMPTOMS   The main symptom of this condition is hearing loss. As a result, you or your child may:  Listen to the TV at a loud volume.  Not respond to questions.  Ask "what" often when spoken to.  Mistake or confuse one sound or word for another.  There may be a sensation of fullness or pressure but usually not pain. DIAGNOSIS   Your health care provider will diagnose this condition by examining you or your child's ears.  Your health care provider may test the pressure in you or your child's ear with a tympanometer.  A hearing test may be conducted if the problem persists. TREATMENT   Treatment depends on the duration and the effects of the effusion.  Antibiotics, decongestants, nose drops, and cortisone-type drugs (tablets or nasal spray) may not be helpful.  Children with persistent ear effusions may have delayed language or behavioral problems. Children at risk for developmental delays in hearing, learning, and speech may require referral to a specialist earlier than children not at risk.  You or your child's health care provider may suggest a referral to an ear, nose, and throat surgeon  for treatment. The following may help restore normal hearing:  Drainage of fluid.  Placement of ear tubes (tympanostomy tubes).  Removal of adenoids (adenoidectomy). HOME CARE INSTRUCTIONS   Avoid secondhand smoke.  Infants who are breastfed are less likely to have this condition.  Avoid feeding infants while they are lying flat.  Avoid known environmental allergens.  Avoid people who are sick. SEEK MEDICAL CARE IF:   Hearing is not better in  3 months.  Hearing is worse.  Ear pain.  Drainage from the ear.  Dizziness. MAKE SURE YOU:   Understand these instructions.  Will watch your condition.  Will get help right away if you are not doing well or get worse. Document Released: 10/18/2004 Document Revised: 01/25/2014 Document Reviewed: 04/07/2013 [facility] Patient Information 2015 [place], [place]. This information is not intended to replace advice given to you by your health care provider. Make sure you discuss any questions you have with your health care provider.  Draining Ear Fluid (drainage) can come from your ear. This may be wax, yellowish-white fluid (pus), blood, or other fluids. An infection, injury, or irritation may cause fluid to drain from your ear.  HOME CARE  Only take medicine as told by your doctor. This may include ear drops.  Do not rub inside your ear with cotton-tipped swabs.  Do not swim until your doctor says it is okay.  Before you take a shower, cover a cotton ball with petroleum jelly. Put it in your ear. This will keep water out.  Stay away from smoke.  Make sure your shots (vaccinations) are up to date.  Wash your hands well.  Keep all doctor visits as told. GET HELP RIGHT AWAY IF:   You have very bad ear pain or a headache.  You have a fever.  The patient is older than 3 months with a rectal temperature of 102F (38.9C) or higher.  The patient is 57 months old or younger with a rectal temperature of 100.42F (38C) or higher.  You throw up (vomit).  You feel dizzy.  You have twitching or shaking (seizure).  You have new hearing loss.  You have more fluid coming from the ear.  You have pain, a fever, or fluid drainage that does not get better within 48 hours of taking medicine.  You are more tired than normal. MAKE SURE YOU:   Understand these instructions.  Will watch your condition.  Will get help right away if you are not doing well or get worse. Document  Released: 02/28/2010 Document Revised: 01/25/2014 Document Reviewed: 02/28/2010 [facility] Patient Information 2015 [place], [place]. This information is not intended to replace advice given to you by your health care provider. Make sure you discuss any questions you have with your health care provider.

## 2015-01-13 NOTE — ED Notes (Signed)
bil ear pain x 2 months  States can't hear from left also states "has been picking at them"

## 2015-01-13 NOTE — ED Provider Notes (Signed)
CSN: [id number]     Arrival date & time 01/13/15  1555 History   First MD Initiated Contact with Patient 01/13/15 1615     Chief Complaint  Patient presents with  . Otalgia     (Consider location/radiation/quality/duration/timing/severity/associated sxs/prior Treatment) HPI Comments: 44 yo female presenting to ED with complaints of bilateral ear pain and drainage x 2 weeks. States she has sores in bilateral ears and pain increases when "picking" at ears with a qtip, sores in her nose and daily nosebleeds. Has been putting Vaseline in ears.  Reports intermittent yellowish drainage on pillow at night.  Previously treated for AOE and tells me ear pain never resolved completely.  She has nasal congestion and takes Claritin intermittently, unable to use prescribed Flonase for nasal congestion due to sores in nose. Denies fever, tinnitus, dizziness, cough or sore throat.  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  Patient is a 45 y.o. female presenting with ear pain.  Otalgia   Past Medical History  Diagnosis Date  . Kidney calculi    Past Surgical History  Procedure Laterality Date  . Abdominal hysterectomy     No family history on file. History  Substance Use Topics  . Smoking status: Never Smoker   . Smokeless tobacco: Not on file  . Alcohol Use: No   OB History    No data available     Review of Systems  HENT: Positive for ear pain.   All other systems reviewed and are negative.     Allergies  Review of patient's allergies indicates no known allergies.  Home Medications   Prior to Admission medications   Not on File   LMP 04/05/2011 Physical Exam  Constitutional: She appears well-developed and well-nourished. No distress.  HENT:  Head: Normocephalic and atraumatic.  Right Ear: Hearing and tympanic membrane normal. There is drainage, swelling and tenderness. No mastoid tenderness. Tympanic membrane is not perforated and not bulging. Tympanic membrane mobility is normal.   Left Ear: There is drainage. No swelling or tenderness. No mastoid tenderness. Tympanic membrane is injected and bulging. Tympanic membrane is not perforated. Tympanic membrane mobility is normal.  Nose: No mucosal edema or rhinorrhea. No epistaxis. Right sinus exhibits no maxillary sinus tenderness and no frontal sinus tenderness. Left sinus exhibits no maxillary sinus tenderness and no frontal sinus tenderness.  Mouth/Throat: Uvula is midline, oropharynx is clear and moist and mucous membranes are normal. No oral lesions. No uvula swelling.  Bilateral preauricular tenderness and swelling.    Nursing note and vitals reviewed.   ED Course  Procedures (including critical care time) Labs Review Labs Reviewed - No data to display  Imaging Review No results found.   EKG Interpretation None      MDM   Final diagnoses:  Otitis externa of both ears  Bilateral acute serous otitis media, recurrence not specified    Pt given ent referral and treatment for external and otitis media. Pt given augmentin and ciprodex. Pt has been sticking qtips and vaseline in ear told pt not to do that    Glendell Docker, NP 01/13/15 1659  Evelina Bucy, MD 01/14/15 2924

## 2015-08-19 ENCOUNTER — Emergency Department (HOSPITAL_BASED_OUTPATIENT_CLINIC_OR_DEPARTMENT_OTHER)
Admission: EM | Admit: 2015-08-19 | Discharge: 2015-08-19 | Disposition: A | Discharge status: Home / Self Care | Payer: 59 | Attending: Emergency Medicine | Admitting: Emergency Medicine

## 2015-08-19 ENCOUNTER — Encounter (HOSPITAL_BASED_OUTPATIENT_CLINIC_OR_DEPARTMENT_OTHER): Payer: Self-pay | Admitting: Emergency Medicine

## 2015-08-19 DIAGNOSIS — R040 Epistaxis: Secondary | ICD-10-CM | POA: Insufficient documentation

## 2015-08-19 DIAGNOSIS — Z87442 Personal history of urinary calculi: Secondary | ICD-10-CM | POA: Diagnosis not present

## 2015-08-19 DIAGNOSIS — Z7951 Long term (current) use of inhaled steroids: Secondary | ICD-10-CM | POA: Diagnosis not present

## 2015-08-19 DIAGNOSIS — N390 Urinary tract infection, site not specified: Secondary | ICD-10-CM | POA: Diagnosis not present

## 2015-08-19 DIAGNOSIS — R300 Dysuria: Secondary | ICD-10-CM | POA: Diagnosis present

## 2015-08-19 LAB — URINALYSIS, ROUTINE W REFLEX MICROSCOPIC
BILIRUBIN URINE: NEGATIVE
Glucose, UA: NEGATIVE mg/dL
Ketones, ur: NEGATIVE mg/dL
Nitrite: NEGATIVE
Protein, ur: NEGATIVE mg/dL
SPECIFIC GRAVITY, URINE: 1.025 (ref 1.005–1.030)
pH: 6.5 (ref 5.0–8.0)

## 2015-08-19 LAB — WET PREP, GENITAL
Sperm: NONE SEEN
TRICH WET PREP: NONE SEEN
YEAST WET PREP: NONE SEEN

## 2015-08-19 LAB — URINE MICROSCOPIC-ADD ON

## 2015-08-19 MED ORDER — NITROFURANTOIN MONOHYD MACRO 100 MG PO CAPS
100.0000 mg | ORAL_CAPSULE | Freq: Once | ORAL | Status: AC
  Administered 2015-08-19: 100 mg via ORAL
  Filled 2015-08-19: qty 1

## 2015-08-19 MED ORDER — NITROFURANTOIN MONOHYD MACRO 100 MG PO CAPS: 100.0000 mg | ORAL_CAPSULE | Freq: Two times a day (BID) | ORAL | Status: DC

## 2015-08-19 MED ORDER — OXYMETAZOLINE HCL 0.05 % NA SOLN
2.0000 | Freq: Two times a day (BID) | NASAL | Status: DC | PRN
  Filled 2015-08-19: qty 15

## 2015-08-19 NOTE — ED Notes (Signed)
Patient states she is having nosebleeds, feels that she is having anal leakage, and vaginal irritation, and abdominal pain on LLQ x1 wk. Denies nausea, vomiting or diarrhea.

## 2015-08-19 NOTE — ED Provider Notes (Signed)
CSN: [id number]     Arrival date & time 08/19/15  0140 History   First MD Initiated Contact with Patient 08/19/15 0220     Chief Complaint  Patient presents with  . Abdominal Pain     (Consider location/radiation/quality/duration/timing/severity/associated sxs/prior Treatment) HPI  This is a 44 year old female with a one-week history of moderate suprapubic pain associated with dysuria and a sensation of anal leakage. She states it burns to urinate and her vaginal area feels irritated. She denies nausea, vomiting or diarrhea. She is status post hysterectomy and has had no vaginal bleeding. She has also been having intermittent nosebleeds for the past 5-6 days. She has been placing Vaseline in her nose to relieve the irritation. She has not having a nosebleed at the present time.  Past Medical History  Diagnosis Date  . Kidney calculi    Past Surgical History  Procedure Laterality Date  . Abdominal hysterectomy     No family history on file. Social History  Substance Use Topics  . Smoking status: Never Smoker   . Smokeless tobacco: None  . Alcohol Use: No   OB History    No data available     Review of Systems  All other systems reviewed and are negative.   Allergies  Review of patient's allergies indicates no known allergies.  Home Medications   Prior to Admission medications   Medication Sig Start Date End Date Taking? Authorizing Provider  amoxicillin-clavulanate (AUGMENTIN) 875-125 MG per tablet Take 1 tablet by mouth every 12 (twelve) hours. 01/13/15  Yes Glendell Docker, NP  fluticasone (FLONASE) 50 MCG/ACT nasal spray Place into both nostrils daily.   Yes Historical Provider, MD   BP 120/86 mmHg  Pulse 70  Temp(Src) 98.8 F (37.1 C) (Oral)  Resp 19  Ht 5\' 4"  (1.626 m)  Wt 160 lb (72.576 kg)  BMI 27.45 kg/m2  SpO2 100%  LMP 04/05/2011   Physical Exam  General: Well-developed, well-nourished female in no acute distress; appearance consistent with age of  record HENT: normocephalic; atraumatic; no bleeding or dried blood in nares Eyes: pupils equal, round and reactive to light; extraocular muscles intact Neck: supple Heart: regular rate and rhythm Lungs: clear to auscultation bilaterally Abdomen: soft; nondistended; suprapubic tenderness, left greater than right; no masses or hepatosplenomegaly; bowel sounds present GU: Normal external genitalia; no vaginal bleeding; physiologic appearing vaginal discharge; bladder tenderness Rectal: No leakage seen; sphincter tone normal; no masses seen or palpated Extremities: No deformity; full range of motion; pulses normal Neurologic: Awake, alert and oriented; motor function intact in all extremities and symmetric; no facial droop Skin: Warm and dry Psychiatric: Normal mood and affect    ED Course  Procedures (including critical care time)    MDM  Nursing notes and vitals signs, including pulse oximetry, reviewed.  Summary of this visit's results, reviewed by myself:  Labs:  Results for orders placed or performed during the hospital encounter of 08/19/15 (from the past 24 hour(s))  Urinalysis, Routine w reflex microscopic (not at [facility])     Status: Abnormal   Collection Time: 08/19/15  2:00 AM  Result Value Ref Range   Color, Urine YELLOW YELLOW   APPearance CLOUDY (A) CLEAR   Specific Gravity, Urine 1.025 1.005 - 1.030   pH 6.5 5.0 - 8.0   Glucose, UA NEGATIVE NEGATIVE mg/dL   Hgb urine dipstick TRACE (A) NEGATIVE   Bilirubin Urine NEGATIVE NEGATIVE   Ketones, ur NEGATIVE NEGATIVE mg/dL   Protein, ur NEGATIVE NEGATIVE mg/dL  Nitrite NEGATIVE NEGATIVE   Leukocytes, UA MODERATE (A) NEGATIVE  Urine microscopic-add on     Status: Abnormal   Collection Time: 08/19/15  2:00 AM  Result Value Ref Range   Squamous Epithelial / LPF 6-30 (A) NONE SEEN   WBC, UA 6-30 0 - 5 WBC/hpf   RBC / HPF 0-5 0 - 5 RBC/hpf   Bacteria, UA MANY (A) NONE SEEN  Wet prep, genital     Status: Abnormal  (Preliminary result)   Collection Time: 08/19/15  2:34 AM  Result Value Ref Range   Yeast Wet Prep HPF POC NONE SEEN NONE SEEN   Trich, Wet Prep NONE SEEN NONE SEEN   Clue Cells Wet Prep HPF POC PRESENT (A) NONE SEEN   WBC, Wet Prep HPF POC MODERATE (A) NONE SEEN   Sperm PENDING    Will treat for UTI.     Shanon Rosser, MD 08/19/15 [phone]

## 2015-08-20 LAB — URINE CULTURE

## 2015-08-22 LAB — GC/CHLAMYDIA PROBE AMP (~~LOC~~) NOT AT ARMC
Chlamydia: NEGATIVE
NEISSERIA GONORRHEA: NEGATIVE

## 2015-08-30 ENCOUNTER — Emergency Department (HOSPITAL_BASED_OUTPATIENT_CLINIC_OR_DEPARTMENT_OTHER)
Admission: EM | Admit: 2015-08-30 | Discharge: 2015-08-30 | Disposition: A | Discharge status: Home / Self Care | Payer: 59 | Attending: Emergency Medicine | Admitting: Emergency Medicine

## 2015-08-30 ENCOUNTER — Encounter (HOSPITAL_BASED_OUTPATIENT_CLINIC_OR_DEPARTMENT_OTHER): Payer: Self-pay | Admitting: Emergency Medicine

## 2015-08-30 DIAGNOSIS — Z79899 Other long term (current) drug therapy: Secondary | ICD-10-CM | POA: Diagnosis not present

## 2015-08-30 DIAGNOSIS — Z7951 Long term (current) use of inhaled steroids: Secondary | ICD-10-CM | POA: Diagnosis not present

## 2015-08-30 DIAGNOSIS — B3731 Acute candidiasis of vulva and vagina: Secondary | ICD-10-CM

## 2015-08-30 DIAGNOSIS — R590 Localized enlarged lymph nodes: Secondary | ICD-10-CM | POA: Diagnosis not present

## 2015-08-30 DIAGNOSIS — B373 Candidiasis of vulva and vagina: Secondary | ICD-10-CM | POA: Diagnosis not present

## 2015-08-30 DIAGNOSIS — R21 Rash and other nonspecific skin eruption: Secondary | ICD-10-CM | POA: Diagnosis present

## 2015-08-30 DIAGNOSIS — Z87442 Personal history of urinary calculi: Secondary | ICD-10-CM | POA: Diagnosis not present

## 2015-08-30 MED ORDER — FLUCONAZOLE 150 MG PO TABS: 150.0000 mg | ORAL_TABLET | Freq: Once | ORAL | Status: DC

## 2015-08-30 MED ORDER — FLUCONAZOLE 100 MG PO TABS
200.0000 mg | ORAL_TABLET | Freq: Once | ORAL | Status: AC
Start: 2015-08-30 — End: 2015-08-30
  Administered 2015-08-30: 200 mg via ORAL
  Filled 2015-08-30: qty 2

## 2015-08-30 MED ORDER — NYSTATIN 100000 UNIT/GM EX POWD: 1.0000 g | Freq: Three times a day (TID) | CUTANEOUS | Status: DC

## 2015-08-30 NOTE — Discharge Instructions (Signed)
1. Medications: Nystatin powder, fluconazole, usual home medications 2. Treatment: rest, drink plenty of fluids, keep vulva area clean and dry 3. Follow Up: Please followup with OB/GYN in 3-5 days for discussion of your diagnoses and further evaluation after today's visit; if you do not have a primary care doctor use the resource guide provided to find one; Please return to the ER for fevers, chills, vaginal bleeding or other concerns    Vaginitis Vaginitis is an inflammation of the vagina. It is most often caused by a change in the normal balance of the bacteria and yeast that live in the vagina. This change in balance causes an overgrowth of certain bacteria or yeast, which causes the inflammation. There are different types of vaginitis, but the most common types are:  Bacterial vaginosis.  Yeast infection (candidiasis).  Trichomoniasis vaginitis. This is a sexually transmitted infection (STI).  Viral vaginitis.  Atrophic vaginitis.  Allergic vaginitis. CAUSES  The cause depends on the type of vaginitis. Vaginitis can be caused by:  Bacteria (bacterial vaginosis).  Yeast (yeast infection).  A parasite (trichomoniasis vaginitis)  A virus (viral vaginitis).  Low hormone levels (atrophic vaginitis). Low hormone levels can occur during pregnancy, breastfeeding, or after menopause.  Irritants, such as bubble baths, scented tampons, and feminine sprays (allergic vaginitis). Other factors can change the normal balance of the yeast and bacteria that live in the vagina. These include:  Antibiotic medicines.  Poor hygiene.  Diaphragms, vaginal sponges, spermicides, birth control pills, and intrauterine devices (IUD).  Sexual intercourse.  Infection.  Uncontrolled diabetes.  A weakened immune system. SYMPTOMS  Symptoms can vary depending on the cause of the vaginitis. Common symptoms include:  Abnormal vaginal discharge.  The discharge is white, gray, or yellow with  bacterial vaginosis.  The discharge is thick, white, and cheesy with a yeast infection.  The discharge is frothy and yellow or greenish with trichomoniasis.  A bad vaginal odor.  The odor is fishy with bacterial vaginosis.  Vaginal itching, pain, or swelling.  Painful intercourse.  Pain or burning when urinating. Sometimes, there are no symptoms. TREATMENT  Treatment will vary depending on the type of infection.   Bacterial vaginosis and trichomoniasis are often treated with antibiotic creams or pills.  Yeast infections are often treated with antifungal medicines, such as vaginal creams or suppositories.  Viral vaginitis has no cure, but symptoms can be treated with medicines that relieve discomfort. Your sexual partner should be treated as well.  Atrophic vaginitis may be treated with an estrogen cream, pill, suppository, or vaginal ring. If vaginal dryness occurs, lubricants and moisturizing creams may help. You may be told to avoid scented soaps, sprays, or douches.  Allergic vaginitis treatment involves quitting the use of the product that is causing the problem. Vaginal creams can be used to treat the symptoms. HOME CARE INSTRUCTIONS   Take all medicines as directed by your caregiver.  Keep your genital area clean and dry. Avoid soap and only rinse the area with water.  Avoid douching. It can remove the healthy bacteria in the vagina.  Do not use tampons or have sexual intercourse until your vaginitis has been treated. Use sanitary pads while you have vaginitis.  Wipe from front to back. This avoids the spread of bacteria from the rectum to the vagina.  Let air reach your genital area.  Wear cotton underwear to decrease moisture buildup.  Avoid wearing underwear while you sleep until your vaginitis is gone.  Avoid tight pants and underwear or  nylons without a cotton panel.  Take off wet clothing (especially bathing suits) as soon as possible.  Use mild,  non-scented products. Avoid using irritants, such as:  Scented feminine sprays.  Fabric softeners.  Scented detergents.  Scented tampons.  Scented soaps or bubble baths.  Practice safe sex and use condoms. Condoms may prevent the spread of trichomoniasis and viral vaginitis. SEEK MEDICAL CARE IF:   You have abdominal pain.  You have a fever or persistent symptoms for more than 2-3 days.  You have a fever and your symptoms suddenly get worse.   This information is not intended to replace advice given to you by your health care provider. Make sure you discuss any questions you have with your health care provider.   Document Released: 07/08/2007 Document Revised: 01/25/2015 Document Reviewed: 02/21/2012 Elsevier Interactive Patient Education 2016 [employer].    Emergency Department Resource Guide 1) Find a Doctor and Pay Out of Pocket Although you won't have to find out who is covered by your insurance plan, it is a good idea to ask around and get recommendations. You will then need to call the office and see if the doctor you have chosen will accept you as a new patient and what types of options they offer for patients who are self-pay. Some doctors offer discounts or will set up payment plans for their patients who do not have insurance, but you will need to ask so you aren't surprised when you get to your appointment.  2) Contact Your Local Health Department Not all health departments have doctors that can see patients for sick visits, but many do, so it is worth a call to see if yours does. If you don't know where your local health department is, you can check in your phone book. The CDC also has a tool to help you locate your state's health department, and many state websites also have listings of all of their local health departments.  3) Find a [facility] If your illness is not likely to be very severe or complicated, you may want to try a walk in clinic. These are  popping up all over the country in pharmacies, drugstores, and shopping centers. They're usually staffed by nurse practitioners or physician assistants that have been trained to treat common illnesses and complaints. They're usually fairly quick and inexpensive. However, if you have serious medical issues or chronic medical problems, these are probably not your best option.  No Primary Care Doctor: - Call Health Connect at  [phone] - they can help you locate a primary care doctor that  accepts your insurance, provides certain services, etc. - Physician Referral Service- [phone]  Chronic Pain Problems: Organization         Address  Phone   Notes  [facility]  [phone] Patients need to be referred by their primary care doctor.   Medication Assistance: Organization         Address  Phone   Notes  [facility] Medication [facility] [address] [place], [place] [address] [phone] --Must be a resident of [facility] -- Must have NO insurance coverage whatsoever (no Medicaid/ Medicare, etc.) -- The pt. MUST have a primary care doctor that directs their care regularly and follows them in the community   MedAssist  [phone]   [employer]  [phone]    Agencies that provide inexpensive medical care: Organization  Address  Phone   Notes  [place]  [phone]   [facility]    [phone]   [facility] [place], [place] [phone]   [place] [address] [place] [place] [phone]   Planned Parenthood    [phone]   [facility]    [phone]   [place] and [place] [address], [place] Phone:  [phone], Fax:  [phone] Hours of Operation:  9 am - 6 pm, M-F.  Also accepts Medicaid/Medicare and self-pay.  [facility] for [place], [address], [place] Phone: [phone], Fax: [phone]. Hours of Operation:  8:30 am - 5:30 pm, M-F.  Also accepts Medicaid and self-pay.  [facility] [address] [place] Phone: [phone]   [place], [place], [place] [phone], Ext. [phone] Mondays & Thursdays: 7-9 AM.  First 15 patients are seen on a first come, first serve basis.    [place] Providers:  Organization         Address  Phone   Notes  [facility] [address], [place] [phone] Also accepts self-pay patients.  [facility] [id number] [place], [place]  [phone]   [place], [address], [place] [phone]   [facility] [address], [place] [phone]   Lucianne Lei [address], [place]   [phone] Only accepts [place] Access [place] patients after they have their name applied to their card.   Self-Pay (no insurance) in [facility]:  Organization         Address  Phone   Notes  Sickle Cell Patients, [facility] Internal Medicine [place] [phone]   [facility] Urgent Care [place] [phone]   [facility] [place]  [place], [place], [place] [phone]   [facility]/Dr. Osei-Bonsu  [address] [place] or [address], [place] [phone] Phone number for both [place] and [place] locations is the same.  Urgent Medical and [facility] [address], [place] [phone]   [facility] [address] [place] or [address] [phone]   [facility] [address] [place] [phone], phone; [phone], fax Sees patients 1st and 3rd Saturday  of every month.  Must not qualify for public or private insurance (i.e. Medicaid, Medicare, [place] Health Choice, Veterans' Benefits)  Household income should be no more than 200% of the poverty level The clinic cannot treat you if you are pregnant or think you are pregnant  Sexually transmitted diseases are not treated at the clinic.    Dental Care: Organization         Address  Phone  Notes  [facility] Department of [facility] [place] [phone] Accepts children up to age 96 who are enrolled in [place] or [place]; pregnant women with a Medicaid card; and children who have applied for Medicaid or [place] Health Choice, but were declined, whose parents can pay a reduced fee at time  of service.  [facility] Department of [facility]  [address], [place] [phone] Accepts children up to age 74 who are enrolled in [place] or [place]; pregnant women with a Medicaid card; and children who have applied for Medicaid or [place] Health Choice, but were declined, whose parents can pay a reduced fee at time of service.  [place] Adult [facility] Access PROGRAM  [place] [phone] Patients are seen by appointment only. Walk-ins are not accepted. [place] will see patients 48 years of age and older. Monday - Tuesday (8am-5pm) Most Wednesdays (8:30-5pm) $30 per visit, cash only  [facility] Adult Dental Access PROGRAM  [address], [facility] [phone] Patients are seen by appointment only. Walk-ins are not accepted. [place] will see patients 59 years of age and older. One Wednesday Evening (Monthly: Volunteer Based).  $30 per visit, cash only  [place]  [phone] for adults; Children under age 49, call Graduate Pediatric Dentistry at [phone]. Children aged 69-14, please call [phone] to request a pediatric application.   Dental services are provided in all areas of dental care including fillings, crowns and bridges, complete and partial dentures, implants, gum treatment, root canals, and extractions. Preventive care is also provided. Treatment is provided to both adults and children. Patients are selected via a lottery and there is often a waiting list.   [facility] [address] [place]  [phone] [email or web address]   [facility] [address] [place], [place] [phone], Ext. [phone] Second and Fourth Thursday of each month, opens at 6:30 AM; Clinic ends at 9 AM.  Patients are seen on a first-come first-served basis, and a limited number are seen during each clinic.   [facility]  [address] Hillard Danker [place], [place] [phone]   Eligibility Requirements You must have lived in [place], [place], or [place] counties for at least the last three months.   You cannot be eligible for state or federal sponsored [employer], including [employer], [place], or [employer].   You generally cannot be eligible for healthcare insurance through your employer.    How to apply: Eligibility screenings are held every Tuesday and Wednesday afternoon from 1:00 pm until 4:00 pm. You do not need an appointment for the interview!  [facility] [address] [place], [place]   [place]  [place] Department  [place]  [phone]    Behavioral Health Resources in the Community: Intensive Outpatient Programs Organization         Address  Phone  Notes  [place]. [address], [place], [place] [phone]   [facility] Outpatient [address], [place], [place]   ADS: Alcohol & Drug Svcs [address] [place], [place]   [place] [address]  [place], [place] or [phone]   Substance Abuse Resources Organization         Address  Phone  Notes  Alcohol and Drug Services  [phone]   [place]  [phone]   [facility] [place]   Chinita Pester  [phone]   Residential & Outpatient Substance Abuse Program  [phone]   Psychological Services Organization         Address  Phone  Notes  [facility]  [place]  [phone]   [place] [address] [place] or [phone]    Mobile Crisis Teams Organization         Address  Phone  Notes  [facility], Mobile Crisis Care Unit  [phone]   [facility]  [address] [place], [place]   [place] Levels [address] [place] [phone]    Self-Help/Support Groups Organization         Address  Phone             Notes  [place]. of [place] - variety of support groups  [place] Call for more information  Narcotics Anonymous (NA), [facility] [address], [employer] [place]  2 meetings at this location   Special educational needs teacher         Address  Phone  Notes  ASAP Residential Treatment [place],    [place]  [phone]   [facility]  [address] [place] [id number], [place], [place]   [place], [place] [phone] Admissions: 8am-3pm M-F  Incentives Substance [place] [address]    [place], [place] [phone]   [facility] [address] [place], [place], [place]   [facility] [address],  [place], [place]   [facility] - Intensive Outpatient [address] Kristeen [address], [place], [place]   [facility] ([place].,  [place], [place]  [phone] or [phone]   [facility] Services (RTS) [address], [place], [place] Accepts Medicaid  [facility] [place] [address]  [place] [phone] Substance Abuse/Addiction Treatment   [facility] Organization         Address  Phone  Notes  [facility]  [phone]   Domenic Schwab, PhD [address] Arlis Porta [place], [place]   [phone] or [phone]   [place], [place] [phone]   [facility] [address], [place], [place] [phone] Insurance/Medicaid/sponsorship through [facility] and Families [address]                                    [place], [place] [phone] [place] [address], [place] [phone]    Dr. Adele Schilder  [phone]   [facility] Dept. 1) [address] [place] 2) [place] 3)  [place] [address], [place] [phone]  [phone]   [place] [phone] or [phone] (After Hours)

## 2015-08-30 NOTE — ED Provider Notes (Signed)
CSN: [id number]     Arrival date & time 08/30/15  1536 History   First MD Initiated Contact with Patient 08/30/15 1600     Chief Complaint  Patient presents with  . Rash     (Consider location/radiation/quality/duration/timing/severity/associated sxs/prior Treatment) Patient is a 44 y.o. female presenting with rash. The history is provided by the patient and medical records. No language interpreter was used.  Rash Associated symptoms: no abdominal pain, no diarrhea, no fatigue, no fever, no headaches, no nausea, no shortness of breath, not vomiting and not wheezing      Christina Holmes is a 44 y.o. female  with a hx of kidney stones presents to the Emergency Department complaining of gradual, persistent, progressively worsening vulvar rash onset 5 days ago.  Patient reports she was treated for urinary tract infection last week and her symptoms began after taking her unknown antibiotic. Record review shows the patient was seen on 08/19/2015 for dysuria and given an nitrofurantoin for UTI. Patient reports completing his antibiotic. Patient reports taking a bath makes the symptoms worse and nothing makes it better. She reports that the rash is intensely itchy and she has associated discharge from the rash.   Past Medical History  Diagnosis Date  . Kidney calculi    Past Surgical History  Procedure Laterality Date  . Abdominal hysterectomy     History reviewed. No pertinent family history. Social History  Substance Use Topics  . Smoking status: Never Smoker   . Smokeless tobacco: None  . Alcohol Use: No   OB History    No data available     Review of Systems  Constitutional: Negative for fever, diaphoresis, appetite change, fatigue and unexpected weight change.  HENT: Negative for mouth sores.   Eyes: Negative for visual disturbance.  Respiratory: Negative for cough, chest tightness, shortness of breath and wheezing.   Cardiovascular: Negative for chest pain.   Gastrointestinal: Negative for nausea, vomiting, abdominal pain, diarrhea and constipation.  Endocrine: Negative for polydipsia, polyphagia and polyuria.  Genitourinary: Positive for vaginal discharge. Negative for dysuria, urgency, frequency and hematuria.       Vaginal itching  Musculoskeletal: Negative for back pain and neck stiffness.  Skin: Positive for rash.  Allergic/Immunologic: Negative for immunocompromised state.  Neurological: Negative for syncope, light-headedness and headaches.  Hematological: Does not bruise/bleed easily.  Psychiatric/Behavioral: Negative for sleep disturbance. The patient is not nervous/anxious.       Allergies  Review of patient's allergies indicates no known allergies.  Home Medications   Prior to Admission medications   Medication Sig Start Date End Date Taking? Authorizing Provider  amoxicillin-clavulanate (AUGMENTIN) 875-125 MG per tablet Take 1 tablet by mouth every 12 (twelve) hours. 01/13/15   Glendell Docker, NP  fluconazole (DIFLUCAN) 150 MG tablet Take 1 tablet (150 mg total) by mouth once. Every 72 hours x 3 doses 08/30/15   Yoel Kaufhold, PA-C  fluticasone (FLONASE) 50 MCG/ACT nasal spray Place into both nostrils daily.    Historical Provider, MD  nitrofurantoin, macrocrystal-monohydrate, (MACROBID) 100 MG capsule Take 1 capsule (100 mg total) by mouth 2 (two) times daily. X 7 days 08/19/15   Shanon Rosser, MD  nystatin (MYCOSTATIN/NYSTOP) 100000 UNIT/GM POWD Apply 1 g topically 3 (three) times daily. 08/30/15   Cassadee Vanzandt, PA-C   BP 101/78 mmHg  Pulse 67  Temp(Src) 98.3 F (36.8 C) (Oral)  Resp 18  Ht 5\' 4"  (1.626 m)  Wt 72.576 kg  BMI 27.45 kg/m2  SpO2 100%  LMP  04/05/2011 Physical Exam  Constitutional: She appears well-developed and well-nourished. No distress.  HENT:  Head: Normocephalic and atraumatic.  Eyes: Conjunctivae are normal.  Neck: Normal range of motion.  Cardiovascular: Normal rate, regular rhythm,  normal heart sounds and intact distal pulses.   No murmur heard. Pulmonary/Chest: Effort normal and breath sounds normal. No respiratory distress. She has no wheezes.  Abdominal: Soft. Bowel sounds are normal. There is no tenderness. There is no rebound and no guarding. Hernia confirmed negative in the right inguinal area and confirmed negative in the left inguinal area.  Genitourinary: No labial fusion. There is rash on the right labia. There is no tenderness or lesion on the right labia. There is rash on the left labia. There is no tenderness or lesion on the left labia. No erythema, tenderness or bleeding in the vagina. No foreign body around the vagina. No signs of injury around the vagina. Vaginal discharge found.  Visible clumpy white discharge from the introitus Beefy red, raised rash with satellite lesions present on the labia minora, labia majora, mons pubis and extending onto the proximal thigh No induration, fluctuance or increased warmth to suggest secondary infection Mild inguinal adenopathy  Musculoskeletal: Normal range of motion. She exhibits no edema.  Lymphadenopathy:       Right: Inguinal adenopathy present.       Left: Inguinal adenopathy present.  Neurological: She is alert.  Skin: Skin is warm and dry. She is not diaphoretic. No erythema.  Psychiatric: She has a normal mood and affect.  Nursing note and vitals reviewed.   ED Course  Procedures (including critical care time) Labs Review Labs Reviewed - No data to display  Imaging Review No results found. I have personally reviewed and evaluated these images and lab results as part of my medical decision-making.   EKG Interpretation None      MDM   Final diagnoses:  Candidal vulvovaginitis   Christina Holmes presents with severe vulvaovaginal candidiasis.  Significant excoriations noted. No induration, erythema or increased warmth to suggest secondary infection or abscess.  Will treat with nystatin powder and  oral Diflucan due to the severity of the rash. Discussed vaginal hygiene.  Patient has completed her antibiotic.  She is afebrile, non-tachycardic and without hypotension. Discussed reasons to return to the emergency department including signs and symptoms of developing secondary infection. Encouraged patient to refrain from scratching.  BP 101/78 mmHg  Pulse 67  Temp(Src) 98.3 F (36.8 C) (Oral)  Resp 18  Ht 5\' 4"  (1.626 m)  Wt 72.576 kg  BMI 27.45 kg/m2  SpO2 100%  LMP 04/05/2011   Abigail Butts, PA-C 08/30/15 [place], MD 08/31/15 0000

## 2015-08-30 NOTE — ED Notes (Signed)
Patient states that she is having a rash to perineum area since she was here a week ago and dx with a UTI

## 2015-09-02 ENCOUNTER — Emergency Department (HOSPITAL_BASED_OUTPATIENT_CLINIC_OR_DEPARTMENT_OTHER)
Admission: EM | Admit: 2015-09-02 | Discharge: 2015-09-03 | Disposition: A | Discharge status: Home / Self Care | Payer: 59 | Attending: Emergency Medicine | Admitting: Emergency Medicine

## 2015-09-02 ENCOUNTER — Encounter (HOSPITAL_BASED_OUTPATIENT_CLINIC_OR_DEPARTMENT_OTHER): Payer: Self-pay | Admitting: *Deleted

## 2015-09-02 DIAGNOSIS — Z7951 Long term (current) use of inhaled steroids: Secondary | ICD-10-CM | POA: Diagnosis not present

## 2015-09-02 DIAGNOSIS — Z90710 Acquired absence of both cervix and uterus: Secondary | ICD-10-CM | POA: Insufficient documentation

## 2015-09-02 DIAGNOSIS — N898 Other specified noninflammatory disorders of vagina: Secondary | ICD-10-CM | POA: Diagnosis present

## 2015-09-02 DIAGNOSIS — Z79899 Other long term (current) drug therapy: Secondary | ICD-10-CM | POA: Diagnosis not present

## 2015-09-02 DIAGNOSIS — A6000 Herpesviral infection of urogenital system, unspecified: Secondary | ICD-10-CM

## 2015-09-02 DIAGNOSIS — Z792 Long term (current) use of antibiotics: Secondary | ICD-10-CM | POA: Insufficient documentation

## 2015-09-02 DIAGNOSIS — A6009 Herpesviral infection of other urogenital tract: Secondary | ICD-10-CM | POA: Diagnosis not present

## 2015-09-02 DIAGNOSIS — Z87442 Personal history of urinary calculi: Secondary | ICD-10-CM | POA: Diagnosis not present

## 2015-09-02 NOTE — ED Notes (Signed)
C/o vaginal dc (brown)  Itching , and pain  Was seen here 3 days ago for same,  Not any better

## 2015-09-02 NOTE — ED Notes (Signed)
Pt with vaginal irritation was seen 3 days ago and DX with BV

## 2015-09-03 ENCOUNTER — Encounter (HOSPITAL_BASED_OUTPATIENT_CLINIC_OR_DEPARTMENT_OTHER): Payer: Self-pay | Admitting: Emergency Medicine

## 2015-09-03 LAB — WET PREP, GENITAL
Sperm: NONE SEEN
TRICH WET PREP: NONE SEEN
YEAST WET PREP: NONE SEEN

## 2015-09-03 MED ORDER — VALACYCLOVIR HCL 1 G PO TABS: 1000.0000 mg | ORAL_TABLET | Freq: Two times a day (BID) | ORAL | Status: DC

## 2015-09-03 MED ORDER — NAPROXEN 250 MG PO TABS
500.0000 mg | ORAL_TABLET | Freq: Once | ORAL | Status: AC
  Administered 2015-09-03: 500 mg via ORAL
  Filled 2015-09-03: qty 2

## 2015-09-03 MED ORDER — ACYCLOVIR 200 MG PO CAPS
400.0000 mg | ORAL_CAPSULE | Freq: Once | ORAL | Status: AC
  Administered 2015-09-03: 400 mg via ORAL
  Filled 2015-09-03: qty 2

## 2015-09-03 MED ORDER — MELOXICAM 15 MG PO TABS: 15.0000 mg | ORAL_TABLET | Freq: Every day | ORAL | Status: DC

## 2015-09-03 NOTE — Discharge Instructions (Signed)
Herpes Simplex Virus Herpes simplex virus is a viral infection that may infect many different areas of the body, such as the genitalia and mouth. There are two different strains of the virus: herpes simplex virus 1 (HSV-1) and herpes simplex virus 2 (HSV-2). HSV-1 is typically associated with infections of the mouth and lips. HSV-2 is associated with infections of the genitals. However, either strain of the virus may infect any area. HSV may be spread through saliva particles or sexual contact. One unusual form of HSV-1, known as herpes gladiatorum, is passed from skin-to-skin contact, such as in wrestling. SYMPTOMS   Sometimes, no symptoms.  Fever.  Headache.  Muscle aches.  Tingling.  Itching.  Tenderness.  Genital burning feeling.  Genital pain.  Pain with urination.  Pain with sexual intercourse.  Small blisters in the affected areas. RISK FACTORS   Kissing an infected person.  Sharing eating utensils with an infected person.  Unprotected sexual activity.  Multiple sexual partners.  Direct contact sports without protective clothing.  Contact with an exposed herpes sore.  Stress, illness, and cold increase the risk of recurrence. PROGNOSIS  The primary outbreak of an HSV infection usually lasts 2 to 3 weeks. However, it has been known to last up to 6 weeks. After the primary outbreak subsides, the virus goes into a stage known as latency. During this time, there may be no physical symptoms of infection. After a period of time, some event, such as stress, cold, or illness will trigger another outbreak. This cycle of latency and outbreak may continue indefinitely. The outbreaks usually become milder over time. The body cannot rid itself of HSV. RELATED COMPLICATIONS   Recurrence.  Infection in other areas of the body, such as the eye (ocular herpetic infection, keratitis) and rarely the brain (herpetic encephalitis). TREATMENT  Many HSV infections can be treated  without medicine. During an outbreak, avoid touching the sores. Ice may be used to dull the pain and suppress the virus. Exposure to the sun is a common trigger for an outbreak, so the use of sunscreen may help in such cases. Avoid sexual contact during outbreaks. During the latent periods, it is advised that you use latex condoms, which will reduce the likelihood of spreading the virus to another person. Condoms made from animal products do not protect against HSV. Female condoms cover a larger area than female condoms, and may offer the most protection from the transmission of HSV. The presence of HSV will not affect a condom's ability to protect against pregnancy. Only take medicines for pain and discomfort if directed to do so by your caregiver. Many claims exist that certain dietary changes will prevent an outbreak, but these claims have not been proven. These claims include eating foods that are high in L-lysine and low in arginine (i.e. yogurt, beets, apples, pears, mangoes, oily fish (such as salmon, haddock, snapper, and swordfish), soybean sprouts, chicken, and tomatoes).  Athletes may return to play once they are showing no symptoms, and they have been treated.    This information is not intended to replace advice given to you by your health care provider. Make sure you discuss any questions you have with your health care provider.   Document Released: 09/10/2005 Document Revised: 12/03/2011 Document Reviewed: 03/30/2015 Elsevier Interactive Patient Education 2016 [employer]  

## 2015-09-03 NOTE — ED Provider Notes (Signed)
CSN: [id number]     Arrival date & time 09/02/15  2254 History   First MD Initiated Contact with Patient 09/02/15 2317     Chief Complaint  Patient presents with  . Vaginal Discharge     (Consider location/radiation/quality/duration/timing/severity/associated sxs/prior Treatment) Patient is a 44 y.o. female presenting with vaginal discharge. The history is provided by the patient.  Vaginal Discharge Quality:  [place] Severity:  Moderate Onset quality:  Gradual Timing:  Constant Progression:  Unchanged Chronicity:  New Context: not during intercourse   Relieved by:  Nothing Worsened by:  Nothing tried Ineffective treatments:  None tried Associated symptoms: genital lesions and rash   Risk factors: no endometriosis and no STI exposure   Seen for UTI and yeast infection but symptoms worse on meds.  Burns when anything touches the lesions.    Past Medical History  Diagnosis Date  . Kidney calculi    Past Surgical History  Procedure Laterality Date  . Abdominal hysterectomy     History reviewed. No pertinent family history. Social History  Substance Use Topics  . Smoking status: Never Smoker   . Smokeless tobacco: None  . Alcohol Use: No   OB History    No data available     Review of Systems  Genitourinary: Positive for vaginal discharge and genital sores. Negative for frequency, flank pain and vaginal bleeding.  All other systems reviewed and are negative.     Allergies  Review of patient's allergies indicates no known allergies.  Home Medications   Prior to Admission medications   Medication Sig Start Date End Date Taking? Authorizing Provider  amoxicillin-clavulanate (AUGMENTIN) 875-125 MG per tablet Take 1 tablet by mouth every 12 (twelve) hours. 01/13/15   Glendell Docker, NP  fluconazole (DIFLUCAN) 150 MG tablet Take 1 tablet (150 mg total) by mouth once. Every 72 hours x 3 doses 08/30/15   Hannah Muthersbaugh, PA-C  fluticasone (FLONASE) 50 MCG/ACT nasal  spray Place into both nostrils daily.    Historical Provider, MD  nitrofurantoin, macrocrystal-monohydrate, (MACROBID) 100 MG capsule Take 1 capsule (100 mg total) by mouth 2 (two) times daily. X 7 days 08/19/15   Shanon Rosser, MD  nystatin (MYCOSTATIN/NYSTOP) 100000 UNIT/GM POWD Apply 1 g topically 3 (three) times daily. 08/30/15   Hannah Muthersbaugh, PA-C   BP 128/87 mmHg  Pulse 68  Temp(Src) 98.2 F (36.8 C) (Oral)  Resp 16  Ht 5\' 4"  (1.626 m)  Wt 160 lb (72.576 kg)  BMI 27.45 kg/m2  SpO2 100%  LMP 04/05/2011 Physical Exam  Constitutional: She is oriented to person, place, and time. She appears well-developed and well-nourished. No distress.  HENT:  Head: Normocephalic and atraumatic.  Mouth/Throat: Oropharynx is clear and moist.  Eyes: Conjunctivae are normal. Pupils are equal, round, and reactive to light.  Neck: Normal range of motion. Neck supple.  Cardiovascular: Normal rate and regular rhythm.   Pulmonary/Chest: Effort normal and breath sounds normal. She has no wheezes. She has no rales.  Abdominal: Soft. She exhibits no distension.  Genitourinary: Vaginal discharge found.  Scant white discharge, no CMT.  Multiple ulcerations of the labia majus and minora and the perineum consistent with herpes. A few scattered blisters  No satellite papules.  No secondary infection Chaperone present  Musculoskeletal: Normal range of motion.  Neurological: She is alert and oriented to person, place, and time.  Skin: Skin is warm and dry.  Psychiatric: She has a normal mood and affect.    ED Course  Procedures (including  critical care time) Labs Review Labs Reviewed  WET PREP, GENITAL  GC/CHLAMYDIA PROBE AMP () NOT AT [facility]    Imaging Review No results found. I have personally reviewed and evaluated these images and lab results as part of my medical decision-making.   EKG Interpretation None      MDM   Final diagnoses:  None    Exam and symptoms consistent  with herpes.  New partner in the past 6 months.  No yeast on wet prep.  Have advised no sexual activity.  Follow up with gynecology.  Will start Valtrex BID x 10 days for first outbreak    Kandi Brusseau, MD 09/03/15 [phone]

## 2015-09-05 LAB — GC/CHLAMYDIA PROBE AMP (~~LOC~~) NOT AT ARMC
Chlamydia: NEGATIVE
Neisseria Gonorrhea: NEGATIVE

## 2016-06-12 ENCOUNTER — Emergency Department (HOSPITAL_BASED_OUTPATIENT_CLINIC_OR_DEPARTMENT_OTHER)
Admission: EM | Admit: 2016-06-12 | Discharge: 2016-06-13 | Disposition: A | Discharge status: Home / Self Care | Payer: Medicaid Other | Attending: Emergency Medicine | Admitting: Emergency Medicine

## 2016-06-12 ENCOUNTER — Encounter (HOSPITAL_BASED_OUTPATIENT_CLINIC_OR_DEPARTMENT_OTHER): Payer: Self-pay | Admitting: *Deleted

## 2016-06-12 DIAGNOSIS — L209 Atopic dermatitis, unspecified: Secondary | ICD-10-CM | POA: Insufficient documentation

## 2016-06-12 DIAGNOSIS — R112 Nausea with vomiting, unspecified: Secondary | ICD-10-CM | POA: Insufficient documentation

## 2016-06-12 NOTE — ED Triage Notes (Signed)
Pt c/o vomiting x 3 weeks also c/o rash x 3 weeks

## 2016-06-13 LAB — CBC WITH DIFFERENTIAL/PLATELET
BASOS PCT: 0 %
Basophils Absolute: 0 10*3/uL (ref 0.0–0.1)
EOS ABS: 0.1 10*3/uL (ref 0.0–0.7)
EOS PCT: 2 %
HEMATOCRIT: 37.2 % (ref 36.0–46.0)
Hemoglobin: 12.1 g/dL (ref 12.0–15.0)
LYMPHS ABS: 1.9 10*3/uL (ref 0.7–4.0)
Lymphocytes Relative: 28 %
MCH: 27.8 pg (ref 26.0–34.0)
MCHC: 32.5 g/dL (ref 30.0–36.0)
MCV: 85.5 fL (ref 78.0–100.0)
MONOS PCT: 9 %
Monocytes Absolute: 0.6 10*3/uL (ref 0.1–1.0)
NEUTROS PCT: 61 %
Neutro Abs: 4.1 10*3/uL (ref 1.7–7.7)
PLATELETS: 305 10*3/uL (ref 150–400)
RBC: 4.35 MIL/uL (ref 3.87–5.11)
RDW: 13.9 % (ref 11.5–15.5)
WBC: 6.7 10*3/uL (ref 4.0–10.5)

## 2016-06-13 LAB — URINALYSIS, ROUTINE W REFLEX MICROSCOPIC
Bilirubin Urine: NEGATIVE
Glucose, UA: NEGATIVE mg/dL
Hgb urine dipstick: NEGATIVE
Ketones, ur: NEGATIVE mg/dL
Nitrite: NEGATIVE
Protein, ur: NEGATIVE mg/dL
SPECIFIC GRAVITY, URINE: 1.018 (ref 1.005–1.030)
pH: 6 (ref 5.0–8.0)

## 2016-06-13 LAB — COMPREHENSIVE METABOLIC PANEL
ALT: 10 U/L — ABNORMAL LOW (ref 14–54)
ANION GAP: 6 (ref 5–15)
AST: 17 U/L (ref 15–41)
Albumin: 3.7 g/dL (ref 3.5–5.0)
Alkaline Phosphatase: 53 U/L (ref 38–126)
BILIRUBIN TOTAL: 0.3 mg/dL (ref 0.3–1.2)
BUN: 11 mg/dL (ref 6–20)
CHLORIDE: 106 mmol/L (ref 101–111)
CO2: 25 mmol/L (ref 22–32)
Calcium: 8.7 mg/dL — ABNORMAL LOW (ref 8.9–10.3)
Creatinine, Ser: 0.65 mg/dL (ref 0.44–1.00)
Glucose, Bld: 92 mg/dL (ref 65–99)
POTASSIUM: 3.6 mmol/L (ref 3.5–5.1)
Sodium: 137 mmol/L (ref 135–145)
TOTAL PROTEIN: 8.1 g/dL (ref 6.5–8.1)

## 2016-06-13 LAB — URINE MICROSCOPIC-ADD ON: RBC / HPF: NONE SEEN RBC/hpf (ref 0–5)

## 2016-06-13 LAB — LIPASE, BLOOD: LIPASE: 28 U/L (ref 11–51)

## 2016-06-13 LAB — PREGNANCY, URINE: PREG TEST UR: NEGATIVE

## 2016-06-13 MED ORDER — SODIUM CHLORIDE 0.9 % IV BOLUS (SEPSIS)
1000.0000 mL | Freq: Once | INTRAVENOUS | Status: AC
  Administered 2016-06-13: 1000 mL via INTRAVENOUS

## 2016-06-13 MED ORDER — ONDANSETRON 4 MG PO TBDP: 4.0000 mg | ORAL_TABLET | Freq: Three times a day (TID) | ORAL | 0 refills | Status: DC | PRN

## 2016-06-13 MED ORDER — ONDANSETRON HCL 4 MG/2ML IJ SOLN
4.0000 mg | Freq: Once | INTRAMUSCULAR | Status: AC
  Administered 2016-06-13: 4 mg via INTRAVENOUS
  Filled 2016-06-13: qty 2

## 2016-06-13 MED ORDER — DIPHENHYDRAMINE HCL 50 MG/ML IJ SOLN
12.5000 mg | Freq: Once | INTRAMUSCULAR | Status: AC
  Administered 2016-06-13: 12.5 mg via INTRAVENOUS
  Filled 2016-06-13: qty 1

## 2016-06-13 MED ORDER — TRIAMCINOLONE ACETONIDE 0.1 % EX CREA: 1.0000 "application " | TOPICAL_CREAM | Freq: Two times a day (BID) | CUTANEOUS | 0 refills | Status: DC

## 2016-06-13 NOTE — ED Notes (Signed)
Pt refuses to drink anything.  MD notified.

## 2016-06-13 NOTE — ED Notes (Signed)
MD at bedside. 

## 2016-06-13 NOTE — ED Provider Notes (Signed)
[place] [facility]PT [facility] Provider Note   CSN: [id number] Arrival date & time: 06/12/16  2322     History   Chief Complaint Chief Complaint  Patient presents with  . Emesis    HPI Christina Holmes is a 45 y.o. female.  HPI  This is a 45 year old female who presents with rash and vomiting. Patient reports 3 week history of worsening itchy rash over the bilateral arms, neck, and groin area. Denies new soaps, detergents, exposures, foods, or medications. She was seen in urgent care yesterday but "they didn't tell what it was." She reports that she got a steroid shot. She states that she thinks the rash is making her sick. She reports nausea and nonbilious, nonbloody emesis. Denies abdominal pain. Denies diarrhea, chest pain, shortness of breath, any other systemic symptoms.  Past Medical History:  Diagnosis Date  . Kidney calculi     Patient Active Problem List   Diagnosis Date Noted  . Kidney calculi     Past Surgical History:  Procedure Laterality Date  . ABDOMINAL HYSTERECTOMY      OB History    No data available       Home Medications    Prior to Admission medications   Medication Sig Start Date End Date Taking? Authorizing Provider  fluticasone (FLONASE) 50 MCG/ACT nasal spray Place into both nostrils daily.    Historical Provider, MD  meloxicam (MOBIC) 15 MG tablet Take 1 tablet (15 mg total) by mouth daily. 09/03/15   April Palumbo, MD  nystatin (MYCOSTATIN/NYSTOP) 100000 UNIT/GM POWD Apply 1 g topically 3 (three) times daily. 08/30/15   Hannah Muthersbaugh, PA-C  ondansetron (ZOFRAN ODT) 4 MG disintegrating tablet Take 1 tablet (4 mg total) by mouth every 8 (eight) hours as needed for nausea or vomiting. 06/13/16   Merryl Hacker, MD  triamcinolone cream (KENALOG) 0.1 % Apply 1 application topically 2 (two) times daily. Do not apply to face 06/13/16   Merryl Hacker, MD  valACYclovir (VALTREX) 1000 MG tablet Take 1 tablet (1,000 mg total) by mouth 2 (two)  times daily. 09/03/15   April Palumbo, MD    Family History No family history on file.  Social History Social History  Substance Use Topics  . Smoking status: Never Smoker  . Smokeless tobacco: Not on file  . Alcohol use No     Allergies   Review of patient's allergies indicates no known allergies.   Review of Systems Review of Systems  Constitutional: Negative for fever.  Respiratory: Negative for shortness of breath.   Cardiovascular: Negative for chest pain.  Gastrointestinal: Positive for nausea and vomiting. Negative for abdominal pain and diarrhea.  Genitourinary: Negative for dysuria.  Skin: Positive for rash.  All other systems reviewed and are negative.    Physical Exam Updated Vital Signs BP 120/80 (BP Location: Left Arm)   Pulse 82   Temp 98.3 F (36.8 C) (Oral)   Resp 16   Ht 5\' 4"  (1.626 m)   Wt 170 lb (77.1 kg)   LMP 04/05/2011   SpO2 100%   BMI 29.18 kg/m   Physical Exam  Constitutional: She is oriented to person, place, and time. She appears well-developed and well-nourished. No distress.  HENT:  Head: Normocephalic and atraumatic.  Cardiovascular: Normal rate, regular rhythm and normal heart sounds.   Pulmonary/Chest: Effort normal and breath sounds normal. No respiratory distress. She has no wheezes.  Abdominal: Soft. Bowel sounds are normal. There is no tenderness. There is no guarding.  Neurological: She is alert and oriented to person, place, and time.  Skin: Skin is warm and dry.  Papular rash noted bilateral upper extremities but concentrated in the antecubital fossa is with associated mild erythema and excoriation, similar rash noted over the posterior neck with lichenification of the skin and hyperpigmentation  Psychiatric: She has a normal mood and affect.  Nursing note and vitals reviewed.    ED Treatments / Results  Labs (all labs ordered are listed, but only abnormal results are displayed) Labs Reviewed  COMPREHENSIVE  METABOLIC PANEL - Abnormal; Notable for the following:       Result Value   Calcium 8.7 (*)    ALT 10 (*)    All other components within normal limits  URINALYSIS, ROUTINE W REFLEX MICROSCOPIC (NOT AT [facility]) - Abnormal; Notable for the following:    Leukocytes, UA MODERATE (*)    All other components within normal limits  URINE MICROSCOPIC-ADD ON - Abnormal; Notable for the following:    Squamous Epithelial / LPF 0-5 (*)    Bacteria, UA RARE (*)    All other components within normal limits  CBC WITH DIFFERENTIAL/PLATELET  LIPASE, BLOOD  PREGNANCY, URINE    EKG  EKG Interpretation None       Radiology No results found.  Procedures Procedures (including critical care time)  Medications Ordered in ED Medications  diphenhydrAMINE (BENADRYL) injection 12.5 mg (12.5 mg Intravenous Given 06/13/16 0139)  ondansetron (ZOFRAN) injection 4 mg (4 mg Intravenous Given 06/13/16 0139)  sodium chloride 0.9 % bolus 1,000 mL (1,000 mLs Intravenous New Bag/Given 06/13/16 0133)     Initial Impression / Assessment and Plan / ED Course  I have reviewed the triage vital signs and the nursing notes.  Pertinent labs & imaging results that were available during my care of the patient were reviewed by me and considered in my medical decision making (see chart for details).  Clinical Course    Patient presents with 3 week history of rash. Also reports nausea and vomiting. Nontoxic. Afebrile. Vital signs reassuring. Given location of rash and appearance, suspicious for eczema or atopic dermatitis.  Likely not related to reported vomiting and nausea. Lab work obtained. No signs of dehydration. Basic lab work is within normal limits. She is nontender on exam. She is fluids and nausea medication. Patient was not willing to attempt oral hydration. Given that she has no clinical signs of dehydration, feel she can be discharged home safely without further workup. Patient provided Zofran for nausea and  triamcinolone for atopic dermatitis.  After history, exam, and medical workup I feel the patient has been appropriately medically screened and is safe for discharge home. Pertinent diagnoses were discussed with the patient. Patient was given return precautions.   Final Clinical Impressions(s) / ED Diagnoses   Final diagnoses:  Atopic dermatitis  Non-intractable vomiting with nausea, vomiting of unspecified type    New Prescriptions New Prescriptions   ONDANSETRON (ZOFRAN ODT) 4 MG DISINTEGRATING TABLET    Take 1 tablet (4 mg total) by mouth every 8 (eight) hours as needed for nausea or vomiting.   TRIAMCINOLONE CREAM (KENALOG) 0.1 %    Apply 1 application topically 2 (two) times daily. Do not apply to face     Merryl Hacker, MD 06/13/16 [phone]

## 2016-06-13 NOTE — ED Notes (Signed)
Have been in room with pt approx 45 min looking for IV access.  1 unsuccessful IV attempt.  Used Ultrasound and could not locate a vein I was willing to attempt.  MD notified.  She will attempt at this time.

## 2016-08-07 ENCOUNTER — Emergency Department (HOSPITAL_BASED_OUTPATIENT_CLINIC_OR_DEPARTMENT_OTHER)
Admission: EM | Admit: 2016-08-07 | Discharge: 2016-08-07 | Disposition: A | Discharge status: Home / Self Care | Payer: Medicaid Other | Attending: Emergency Medicine | Admitting: Emergency Medicine

## 2016-08-07 ENCOUNTER — Emergency Department (HOSPITAL_BASED_OUTPATIENT_CLINIC_OR_DEPARTMENT_OTHER): Payer: Medicaid Other

## 2016-08-07 ENCOUNTER — Encounter (HOSPITAL_BASED_OUTPATIENT_CLINIC_OR_DEPARTMENT_OTHER): Payer: Self-pay | Admitting: *Deleted

## 2016-08-07 DIAGNOSIS — R079 Chest pain, unspecified: Secondary | ICD-10-CM

## 2016-08-07 DIAGNOSIS — R0789 Other chest pain: Secondary | ICD-10-CM | POA: Insufficient documentation

## 2016-08-07 LAB — BASIC METABOLIC PANEL
Anion gap: 6 (ref 5–15)
BUN: 11 mg/dL (ref 6–20)
CHLORIDE: 102 mmol/L (ref 101–111)
CO2: 28 mmol/L (ref 22–32)
CREATININE: 0.73 mg/dL (ref 0.44–1.00)
Calcium: 8.8 mg/dL — ABNORMAL LOW (ref 8.9–10.3)
GFR calc Af Amer: >60 mL/min (ref >60)
GFR calc non Af Amer: >60 mL/min (ref >60)
Glucose, Bld: 88 mg/dL (ref 65–99)
POTASSIUM: 3.8 mmol/L (ref 3.5–5.1)
SODIUM: 136 mmol/L (ref 135–145)

## 2016-08-07 LAB — CBC WITH DIFFERENTIAL/PLATELET
BASOS ABS: 0.1 10*3/uL (ref 0.0–0.1)
BASOS PCT: 1 %
Band Neutrophils: 1 %
EOS PCT: 0 %
Eosinophils Absolute: 0 10*3/uL (ref 0.0–0.7)
HEMATOCRIT: 38.7 % (ref 36.0–46.0)
HEMOGLOBIN: 12.2 g/dL (ref 12.0–15.0)
Lymphocytes Relative: 42 %
Lymphs Abs: 2.8 10*3/uL (ref 0.7–4.0)
MCH: 27.5 pg (ref 26.0–34.0)
MCHC: 31.5 g/dL (ref 30.0–36.0)
MCV: 87.4 fL (ref 78.0–100.0)
MONOS PCT: 10 %
Monocytes Absolute: 0.7 10*3/uL (ref 0.1–1.0)
NEUTROS ABS: 3.1 10*3/uL (ref 1.7–7.7)
NEUTROS PCT: 46 %
Platelets: 267 10*3/uL (ref 150–400)
RBC: 4.43 MIL/uL (ref 3.87–5.11)
RDW: 14.8 % (ref 11.5–15.5)
WBC: 6.7 10*3/uL (ref 4.0–10.5)

## 2016-08-07 LAB — TROPONIN I: Troponin I: <0.03 ng/mL (ref <0.03)

## 2016-08-07 MED ORDER — NAPROXEN 250 MG PO TABS
500.0000 mg | ORAL_TABLET | Freq: Once | ORAL | Status: AC
  Administered 2016-08-07: 500 mg via ORAL
  Filled 2016-08-07: qty 2

## 2016-08-07 MED ORDER — NAPROXEN 500 MG PO TABS: 500.0000 mg | ORAL_TABLET | Freq: Two times a day (BID) | ORAL | 0 refills | Status: DC

## 2016-08-07 NOTE — ED Notes (Signed)
ED Provider at bedside. 

## 2016-08-07 NOTE — ED Notes (Signed)
Patient transported to X-ray 

## 2016-08-07 NOTE — Discharge Instructions (Signed)
Take the prescribed medication as directed. Follow-up with a primary care doctor in the area. Return to the ED for new or worsening symptoms.

## 2016-08-07 NOTE — ED Triage Notes (Signed)
substernal intermittent CP x 2-3 weeks.  Positional CP, reproducible on palpation.

## 2016-08-07 NOTE — ED Provider Notes (Signed)
[place] DEPT [facility] Provider Note   CSN: [id number] Arrival date & time: 08/07/16  1559  By signing my name below, I, Emmanuella Mensah, attest that this documentation has been prepared under the direction and in the presence of Quincy Carnes, PA-C. Electronically Signed: Judithann Sauger, ED Scribe. 08/07/16. 4:27 PM.  History   Chief Complaint Chief Complaint  Patient presents with  . Chest Pain   HPI Comments: Christina Holmes is a 45 y.o. female who presents to the Emergency Department complaining of gradually worsening constant non-radiating moderate mid-sternal chest pain she describes as sharp and heavy onset 2-3 weeks ago. She notes that the pain is worse with palpation, when she sits upright, or bends over but standing up makes the pain better.  No SOB or diaphoresis. She adds that she has had a decrease in her appetite because when she eats "heavy meals" she vomits the food back up and sometimes it is pink colored. She denies any known falls, injuries, trauma, or change in activity. Pt has not tried any medications PTA. She has NKDA. She states that she was evaluated by her PCP last week for an annual physical but did not mention her CP. She reports a hx of heart issues with her mother and hypertension with her father. She denies any fever, dizziness, syncope, nausea, abdominal pain, cough, neck pain, or any other symptoms.  No recent URI symptoms.  No heavy lifting or noted injuries to chest wall.  She is not a smoker.  The history is provided by the patient. No language interpreter was used.    Past Medical History:  Diagnosis Date  . Kidney calculi     Patient Active Problem List   Diagnosis Date Noted  . Kidney calculi     Past Surgical History:  Procedure Laterality Date  . ABDOMINAL HYSTERECTOMY      OB History    No data available       Home Medications    Prior to Admission medications   Medication Sig Start Date End Date Taking? Authorizing Provider   fluticasone (FLONASE) 50 MCG/ACT nasal spray Place into both nostrils daily.    Historical Provider, MD  triamcinolone cream (KENALOG) 0.1 % Apply 1 application topically 2 (two) times daily. Do not apply to face 06/13/16   Merryl Hacker, MD    Family History History reviewed. No pertinent family history.  Social History Social History  Substance Use Topics  . Smoking status: Never Smoker  . Smokeless tobacco: Not on file  . Alcohol use No     Allergies   Patient has no known allergies.   Review of Systems Review of Systems  Constitutional: Negative for diaphoresis and fever.  Respiratory: Negative for cough and shortness of breath.   Cardiovascular: Positive for chest pain.  Gastrointestinal: Negative for nausea and vomiting.  Musculoskeletal: Negative for neck pain and neck stiffness.  Neurological: Negative for dizziness and syncope.  All other systems reviewed and are negative.    Physical Exam Updated Vital Signs BP 122/79 (BP Location: Left Arm)   Pulse 64   Temp 98.3 F (36.8 C) (Oral)   Resp 18   Ht 5\' 4"  (1.626 m)   Wt 180 lb (81.6 kg)   LMP 04/05/2011   SpO2 100%   BMI 30.90 kg/m   Physical Exam  Constitutional: She is oriented to person, place, and time. She appears well-developed and well-nourished.  HENT:  Head: Normocephalic and atraumatic.  Mouth/Throat: Oropharynx is clear and  moist.  Eyes: Conjunctivae and EOM are normal. Pupils are equal, round, and reactive to light.  Neck: Normal range of motion.  No JVD  Cardiovascular: Normal rate, regular rhythm and normal heart sounds.   Pulmonary/Chest: Effort normal and breath sounds normal.  Midsternal chest wall is tender to palpation without noted for me or signs of trauma, lungs clear bilaterally, no distress  Abdominal: Soft. Bowel sounds are normal.  Musculoskeletal: Normal range of motion.  No pitting edema, no calf swelling or tenderness  Neurological: She is alert and oriented to  person, place, and time.  Skin: Skin is warm and dry.  Psychiatric: She has a normal mood and affect.  Nursing note and vitals reviewed.    ED Treatments / Results  DIAGNOSTIC STUDIES: Oxygen Saturation is 100% on RA, normal by my interpretation.    COORDINATION OF CARE: 4:23 PM- Pt advised of plan for treatment and pt agrees. Pt will receive lab work, chest x-ray, and EKG for further evaluation.    Labs (all labs ordered are listed, but only abnormal results are displayed) Labs Reviewed - No data to display  EKG  EKG Interpretation None       Radiology No results found.  Procedures Procedures (including critical care time)  Medications Ordered in ED Medications - No data to display   Initial Impression / Assessment and Plan / ED Course  Quincy Carnes, PA-C has reviewed the triage vital signs and the nursing notes.  Pertinent labs & imaging results that were available during my care of the patient were reviewed by me and considered in my medical decision making (see chart for details).  Clinical Course    45 year old female here with constant chest pain for the last several weeks. She is afebrile and nontoxic. Her pain is reproducible on exam with palpation of the midsternal region. There are no signs of trauma or injuries. She has no peripheral edema or JVD to suggest acute CHF/fluid overload. Her EKG is nonischemic. Labwork is reassuring. Her vitals are stable on room air. Patient is PERC negative.  Low risk heart score of 1 (family history is only RF).  Given negative workup here after prolonged symptoms and reproducible nature of her pain on exam, low suspicion for ACS, PE, dissection, or other acute cardiac event. Pain is likely musculoskeletal. Will discharge home with anti-inflammatories. She was encouraged to follow-up with her primary care doctor in the area-- resource guide given to help with this.  Discussed plan with patient, she acknowledged understanding and  agreed with plan of care.  Return precautions given for new or worsening symptoms.  Final Clinical Impressions(s) / ED Diagnoses   Final diagnoses:  Chest pain, unspecified type    New Prescriptions Discharge Medication List as of 08/07/2016  5:43 PM    START taking these medications   Details  naproxen (NAPROSYN) 500 MG tablet Take 1 tablet (500 mg total) by mouth 2 (two) times daily with a meal., Starting Tue 08/07/2016, Print       I personally performed the services described in this documentation, which was scribed in my presence. The recorded information has been reviewed and is accurate.   Larene Pickett, PA-C 08/07/16 [place], MD 08/07/16 [phone]

## 2017-05-12 ENCOUNTER — Encounter (HOSPITAL_BASED_OUTPATIENT_CLINIC_OR_DEPARTMENT_OTHER): Payer: Self-pay | Admitting: Emergency Medicine

## 2017-05-12 ENCOUNTER — Emergency Department (HOSPITAL_BASED_OUTPATIENT_CLINIC_OR_DEPARTMENT_OTHER)
Admission: EM | Admit: 2017-05-12 | Discharge: 2017-05-13 | Disposition: A | Discharge status: Home / Self Care | Payer: Medicaid Other | Attending: Emergency Medicine | Admitting: Emergency Medicine

## 2017-05-12 ENCOUNTER — Emergency Department (HOSPITAL_BASED_OUTPATIENT_CLINIC_OR_DEPARTMENT_OTHER): Payer: Medicaid Other

## 2017-05-12 DIAGNOSIS — L03818 Cellulitis of other sites: Secondary | ICD-10-CM | POA: Insufficient documentation

## 2017-05-12 DIAGNOSIS — L03316 Cellulitis of umbilicus: Secondary | ICD-10-CM

## 2017-05-12 LAB — CBC WITH DIFFERENTIAL/PLATELET
Basophils Absolute: 0 10*3/uL (ref 0.0–0.1)
Basophils Relative: 0 %
EOS PCT: 1 %
Eosinophils Absolute: 0 10*3/uL (ref 0.0–0.7)
HCT: 34.7 % — ABNORMAL LOW (ref 36.0–46.0)
Hemoglobin: 11.4 g/dL — ABNORMAL LOW (ref 12.0–15.0)
LYMPHS ABS: 2.3 10*3/uL (ref 0.7–4.0)
Lymphocytes Relative: 50 %
MCH: 28.4 pg (ref 26.0–34.0)
MCHC: 32.9 g/dL (ref 30.0–36.0)
MCV: 86.3 fL (ref 78.0–100.0)
MONOS PCT: 8 %
Monocytes Absolute: 0.4 10*3/uL (ref 0.1–1.0)
Neutro Abs: 1.9 10*3/uL (ref 1.7–7.7)
Neutrophils Relative %: 41 %
PLATELETS: 273 10*3/uL (ref 150–400)
RBC: 4.02 MIL/uL (ref 3.87–5.11)
RDW: 14.1 % (ref 11.5–15.5)
WBC: 4.6 10*3/uL (ref 4.0–10.5)

## 2017-05-12 LAB — COMPREHENSIVE METABOLIC PANEL
ALT: 10 U/L — ABNORMAL LOW (ref 14–54)
ANION GAP: 8 (ref 5–15)
AST: 15 U/L (ref 15–41)
Albumin: 3.8 g/dL (ref 3.5–5.0)
Alkaline Phosphatase: 48 U/L (ref 38–126)
BUN: 8 mg/dL (ref 6–20)
CALCIUM: 8.3 mg/dL — AB (ref 8.9–10.3)
CO2: 25 mmol/L (ref 22–32)
Chloride: 104 mmol/L (ref 101–111)
Creatinine, Ser: 0.95 mg/dL (ref 0.44–1.00)
GFR calc non Af Amer: >60 mL/min (ref >60)
GLUCOSE: 101 mg/dL — AB (ref 65–99)
Potassium: 3 mmol/L — ABNORMAL LOW (ref 3.5–5.1)
SODIUM: 137 mmol/L (ref 135–145)
TOTAL PROTEIN: 7.4 g/dL (ref 6.5–8.1)
Total Bilirubin: 0.4 mg/dL (ref 0.3–1.2)

## 2017-05-12 LAB — URINALYSIS, MICROSCOPIC (REFLEX)

## 2017-05-12 LAB — URINALYSIS, ROUTINE W REFLEX MICROSCOPIC
BILIRUBIN URINE: NEGATIVE
GLUCOSE, UA: NEGATIVE mg/dL
Hgb urine dipstick: NEGATIVE
KETONES UR: NEGATIVE mg/dL
Nitrite: NEGATIVE
PH: 6 (ref 5.0–8.0)
Protein, ur: NEGATIVE mg/dL
Specific Gravity, Urine: 1.02 (ref 1.005–1.030)

## 2017-05-12 LAB — LIPASE, BLOOD: Lipase: 31 U/L (ref 11–51)

## 2017-05-12 MED ORDER — DOXYCYCLINE HYCLATE 100 MG PO CAPS: 100.0000 mg | ORAL_CAPSULE | Freq: Two times a day (BID) | ORAL | 0 refills | Status: AC

## 2017-05-12 MED ORDER — DOXYCYCLINE HYCLATE 100 MG PO TABS
100.0000 mg | ORAL_TABLET | Freq: Once | ORAL | Status: AC
  Administered 2017-05-12: 100 mg via ORAL
  Filled 2017-05-12: qty 1

## 2017-05-12 MED ORDER — FLUCONAZOLE 50 MG PO TABS
150.0000 mg | ORAL_TABLET | Freq: Once | ORAL | Status: AC
  Administered 2017-05-12: 150 mg via ORAL
  Filled 2017-05-12: qty 1

## 2017-05-12 MED ORDER — IOPAMIDOL (ISOVUE-300) INJECTION 61%
100.0000 mL | Freq: Once | INTRAVENOUS | Status: AC | PRN
  Administered 2017-05-12: 100 mL via INTRAVENOUS

## 2017-05-12 NOTE — ED Notes (Signed)
Pt to CT at this time, no changes.

## 2017-05-12 NOTE — ED Triage Notes (Signed)
Patient states that for the last week she has had an area on her navel that has itched and now is having drainage to the region. Reports that if she eats soft items she is ok but if it is hard, or she forces herself to have a BM her stomach hurts

## 2017-05-12 NOTE — ED Notes (Signed)
EDPA into room, prior to RN assessment, see PA notes, orders received and intiated.

## 2017-05-12 NOTE — ED Notes (Signed)
Alert, NAD, calm, interactive, resps e/u, speaking in clear complete sentences, no dyspnea noted, skin W&D.

## 2017-05-12 NOTE — ED Notes (Signed)
Up to b/r, steady gait. No changes.

## 2017-05-12 NOTE — ED Provider Notes (Signed)
[facility] Provider Note   CSN: [id number] Arrival date & time: 05/12/17  1832     History   Chief Complaint Chief Complaint  Patient presents with  . Wound Check    HPI Christina Holmes is a 46 y.o. female.  Emergency department with chief complaint of umbilical discharge and abdominal pain. The patient is an extraordinarily poor historian. History is limited by the mental capacity of the patient, patient's ability to communicate effectively, and overall poor insight. Patient states that she has had pain in her umbilicus for the past week has had drainage coming from her umbilicus. She states that the pain is worsened every time she tries to eat. She has been limiting her diet to liquids and soft foods like Jell-O, applesauce and water and tea. She denies vomiting, diarrhea or constipation. She has no previous history of abdominal surgeries. She complains of painful irritated umbilicus and states that  . She has had significant and copious liquid draining from her umbilicus. She has had to wear a pad over her abdomen to work. She denies fevers, chills. He had no known history of diabetes or other immune compromise.   HPI  Past Medical History:  Diagnosis Date  . Kidney calculi     Patient Active Problem List   Diagnosis Date Noted  . Kidney calculi     Past Surgical History:  Procedure Laterality Date  . ABDOMINAL HYSTERECTOMY      OB History    No data available       Home Medications    Prior to Admission medications   Medication Sig Start Date End Date Taking? Authorizing Provider  fluticasone (FLONASE) 50 MCG/ACT nasal spray Place into both nostrils daily.    [provider]  naproxen (NAPROSYN) 500 MG tablet Take 1 tablet (500 mg total) by mouth 2 (two) times daily with a meal. 08/07/16   Larene Pickett, PA-C  triamcinolone cream (KENALOG) 0.1 % Apply 1 application topically 2 (two) times daily. Do not apply to face 06/13/16   Horton,  Barbette Hair, MD    Family History History reviewed. No pertinent family history.  Social History Social History  Substance Use Topics  . Smoking status: Never Smoker  . Smokeless tobacco: Never Used  . Alcohol use No     Allergies   Patient has no known allergies.   Review of Systems Review of Systems Ten systems reviewed and are negative for acute change, except as noted in the HPI.    Physical Exam Updated Vital Signs BP (!) 114/58 (BP Location: Right Arm)   Pulse (!) 54   Temp 98.5 F (36.9 C) (Oral)   Resp 18   Ht 5\' 4"  (1.626 m)   Wt 81.6 kg (180 lb)   LMP 04/05/2011   SpO2 100%   BMI 30.90 kg/m   Physical Exam Physical Exam  Nursing note and vitals reviewed. Constitutional: She is oriented to person, place, and time. She appears well-developed and well-nourished. No distress.  HENT:  Head: Normocephalic and atraumatic.  Eyes: Conjunctivae normal and EOM are normal. Pupils are equal, round, and reactive to light. No scleral icterus.  Neck: Normal range of motion.  Cardiovascular: Normal rate, regular rhythm and normal heart sounds.  Exam reveals no gallop and no friction rub.   No murmur heard. Pulmonary/Chest: Effort normal and breath sounds normal. No respiratory distress.  Abdominal: Obese abdomen. The umbilicus is markedly erythematous. There is crusted discharge. Circumferential to the umbilicus.  There appears to be clear and brown discharge emanating from a deeper area of the umbilicus. I'm unable to see any tracks or fistulas. It is exquisitely tender to palpation. The surrounding and underlying abdominal cavity. It is also exquisitely tender to palpation There is no guarding.  Neurological: She is alert and oriented to person, place, and time.  Skin: Skin is warm and dry. She is not diaphoretic.     ED Treatments / Results  Labs (all labs ordered are listed, but only abnormal results are displayed) Labs Reviewed  CBC WITH DIFFERENTIAL/PLATELET    COMPREHENSIVE METABOLIC PANEL  LIPASE, BLOOD  URINALYSIS, ROUTINE W REFLEX MICROSCOPIC    EKG  EKG Interpretation None       Radiology No results found.  Procedures Procedures (including critical care time)  Medications Ordered in ED Medications - No data to display   Initial Impression / Assessment and Plan / ED Course  I have reviewed the triage vital signs and the nursing notes.  Pertinent labs & imaging results that were available during my care of the patient were reviewed by me and considered in my medical decision making (see chart for details).     CT scan shows no fistula or fluid collection. Patient presentation consistent with cellulitis. Afebrile. No tachycardia, hypotension or other symptoms suggestive of severe infection. Area has been demarcated and pt advised to follow up for wound check in 2-3 days, sooner for worsening systemic symptoms, new lymphangitis, or significant spread of erythema past line of demarcation. Return precautions discussed. Pt appears safe for discharge.    Final Clinical Impressions(s) / ED Diagnoses   Final diagnoses:  Cellulitis, umbilical    New Prescriptions New Prescriptions   No medications on file     Margarita Mail, PA-C 05/13/17 0107    Mesner, Corene Cornea, MD 05/15/17 [phone]

## 2017-05-12 NOTE — Discharge Instructions (Signed)
You have been diagnosed with cellulitis. Cellulitis is a bacterial infection of your skin. I am discharging you with the following medications: 1)doxycycline Please take all of your antibiotics until finished!   You may develop abdominal discomfort or diarrhea from the antibiotic.  You may help offset this with probiotics which you can buy or get in yogurt. Do not eat  or take the probiotics until 2 hours after your antibiotic.   Follow up with your primary care doctor in 2-3 days. If you do not have a primary care doctor please follow up at the [facility].  HOME CARE INSTRUCTIONS  Take your antibiotics as directed. Finish them even if you start to feel better. Keep the infected arm or leg elevated to reduce swelling. Apply a warm cloth to the affected area up to 4 times per day to relieve pain. Only take over-the-counter or prescription medicines for pain, discomfort, or fever as directed by your caregiver. Keep all follow-up appointments as directed by your caregiver. SEEK MEDICAL CARE IF:  You notice red streaks coming from the infected area. Your red area gets larger or turns dark in color. Your bone or joint underneath the infected area becomes painful after the skin has healed. Your infection returns in the same area or another area. You notice a swollen bump in the infected area. You develop new symptoms. SEEK IMMEDIATE MEDICAL CARE IF:  You have a fever. You feel very sleepy. You develop vomiting or diarrhea. You have a general ill feeling (malaise) with muscle aches and pains.

## 2017-05-12 NOTE — ED Notes (Signed)
Attempted to start and IV, patient states "you wont get anything".  Upon stick got blood returned patient started yelling stating "it hurts you arent getting anything and jerked". Informed patient I had blood return but she would need to calm down to I can advance the catheter.  Unable to advance catheter after patient jerked.  When taping a bandage she said I have already puffed up.  Informed patient that there is no puffing noticed.  Jon RN notified to start IV and draw blood.

## 2017-06-04 ENCOUNTER — Encounter (HOSPITAL_BASED_OUTPATIENT_CLINIC_OR_DEPARTMENT_OTHER): Payer: Self-pay | Admitting: Emergency Medicine

## 2017-06-04 ENCOUNTER — Emergency Department (HOSPITAL_BASED_OUTPATIENT_CLINIC_OR_DEPARTMENT_OTHER)
Admission: EM | Admit: 2017-06-04 | Discharge: 2017-06-04 | Disposition: A | Discharge status: Home / Self Care | Payer: Self-pay | Attending: Emergency Medicine | Admitting: Emergency Medicine

## 2017-06-04 DIAGNOSIS — Z79899 Other long term (current) drug therapy: Secondary | ICD-10-CM | POA: Insufficient documentation

## 2017-06-04 DIAGNOSIS — Z4801 Encounter for change or removal of surgical wound dressing: Secondary | ICD-10-CM | POA: Insufficient documentation

## 2017-06-04 DIAGNOSIS — Z5189 Encounter for other specified aftercare: Secondary | ICD-10-CM

## 2017-06-04 LAB — CBC
HEMATOCRIT: 37.7 % (ref 36.0–46.0)
HEMOGLOBIN: 12.4 g/dL (ref 12.0–15.0)
MCH: 28.4 pg (ref 26.0–34.0)
MCHC: 32.9 g/dL (ref 30.0–36.0)
MCV: 86.5 fL (ref 78.0–100.0)
PLATELETS: 238 10*3/uL (ref 150–400)
RBC: 4.36 MIL/uL (ref 3.87–5.11)
RDW: 14 % (ref 11.5–15.5)
WBC: 5 10*3/uL (ref 4.0–10.5)

## 2017-06-04 NOTE — Discharge Instructions (Signed)
°  Please keep your wound clean and dry. Try packing it with gauze to soak up moisture and keep the surrounding skin dry and free from irritation.  Please schedule an appointment with the doctor who did your hysterectomy surgery. Also follow up with your dermatologist as scheduled.

## 2017-06-04 NOTE — ED Notes (Signed)
Pt verbalizes understanding of d/c instructions and denies any further needs at this time. 

## 2017-06-04 NOTE — ED Provider Notes (Signed)
[facility] DE[facility] Provider Note   CSN: [id number] Arrival date & time: 06/04/17  1532  History   Chief Complaint Chief Complaint  Patient presents with  . Wound Check    HPI Christina Holmes is a 46 y.o. female presenting with belly button wound and drainage. She was seen here 8/20 for same and diagnosed with cellulitis, given course of doxy for 10 days which she completed with no improvement. Afterwards she was on family vacation at [facility], pain and drainage from belly button continued so she sought care at ED there. She was given more antibiotics- unsure what type- for ten more days again with no improvement.  Today she is coming in because her belly button is draining a large amount of odorless clear fluid throughout the day. She wears a towel around her stomach to catch it at work and 2 shirts to work as it will soak through. She has pain around her belly button. She cleans it in shower with soap and water and this causes burning. She endorses chills. No subjective fever. No nausea or vomiting, no blood from the wound.  She does have a history of eczema, sees dermatologist at [facility].  She had laproscopic TAH about 1 year ago and had difficulty with umbilical wound closing afterwards.   HPI  Past Medical History:  Diagnosis Date  . Kidney calculi     Patient Active Problem List   Diagnosis Date Noted  . Kidney calculi     Past Surgical History:  Procedure Laterality Date  . ABDOMINAL HYSTERECTOMY      OB History    No data available       Home Medications    Prior to Admission medications   Medication Sig Start Date End Date Taking? Authorizing Provider  doxycycline (VIBRAMYCIN) 100 MG capsule Take 1 capsule (100 mg total) by mouth 2 (two) times daily. 05/12/17   Harris, Abigail, PA-C  fluticasone (FLONASE) 50 MCG/ACT nasal spray Place into both nostrils daily.    [provider]  naproxen (NAPROSYN) 500 MG tablet Take 1 tablet (500 mg total)  by mouth 2 (two) times daily with a meal. 08/07/16   Larene Pickett, PA-C  triamcinolone cream (KENALOG) 0.1 % Apply 1 application topically 2 (two) times daily. Do not apply to face 06/13/16   Horton, Barbette Hair, MD    Family History History reviewed. No pertinent family history.  Social History Social History  Substance Use Topics  . Smoking status: Never Smoker  . Smokeless tobacco: Never Used  . Alcohol use No     Allergies   Patient has no known allergies.   Review of Systems Review of Systems  Constitutional: Positive for chills. Negative for activity change, appetite change, diaphoresis, fatigue and fever.  HENT: Negative for congestion.   Eyes: Negative for pain and discharge.  Respiratory: Negative for cough, chest tightness and shortness of breath.   Cardiovascular: Negative for chest pain.  Gastrointestinal: Positive for abdominal pain. Negative for constipation, diarrhea, nausea and vomiting.  Genitourinary: Negative for dysuria.  Musculoskeletal: Negative for arthralgias and myalgias.  Skin: Positive for rash and wound.  Neurological: Negative for weakness, numbness and headaches.     Physical Exam Updated Vital Signs BP 119/74 (BP Location: Right Arm)   Pulse (!) 56   Temp 98.7 F (37.1 C) (Oral)   Resp 18   Ht 5\' 4"  (1.626 m)   Wt 81.6 kg (180 lb)   LMP 04/05/2011   SpO2  100%   BMI 30.90 kg/m   Physical Exam  Constitutional: She is oriented to person, place, and time. She appears well-developed and well-nourished. No distress.  HENT:  Head: Normocephalic and atraumatic.  Mouth/Throat: Oropharynx is clear and moist.  Eyes: Pupils are equal, round, and reactive to light. EOM are normal.  Neck: Normal range of motion. Neck supple.  Cardiovascular: Normal rate, regular rhythm, normal heart sounds and intact distal pulses.   Pulmonary/Chest: Effort normal and breath sounds normal. No respiratory distress.  Abdominal: Soft. There is tenderness. There  is guarding.  Clear drainage from umbilicus present, no open wound appreciated, no fistula located. Drainage non-purulent, non-bloody. No fluctuance. Surrounding skin macerated with breakdown evident.  Musculoskeletal: Normal range of motion. She exhibits no edema.  Lymphadenopathy:    She has no cervical adenopathy.  Neurological: She is alert and oriented to person, place, and time. She exhibits normal muscle tone.  Skin: Skin is warm and dry.  Psychiatric: She has a normal mood and affect. Her behavior is normal.     ED Treatments / Results  Labs (all labs ordered are listed, but only abnormal results are displayed) Labs Reviewed  CBC    EKG  EKG Interpretation None       Radiology No results found.  Procedures Procedures (including critical care time)  Medications Ordered in ED Medications - No data to display   Initial Impression / Assessment and Plan / ED Course  I have reviewed the triage vital signs and the nursing notes.  Pertinent labs & imaging results that were available during my care of the patient were reviewed by me and considered in my medical decision making (see chart for details).     Patient with drainage from umbilicus and subsequent skin breakdown and irritation surrounding umbilicus. Unclear source of drainage. Bedside US did not demonstrate any abscess or signs of cellulitis. Normal WBC. Afebrile. Well appearing. Patient stable for discharge home. Instructed patient to keep umbilicus packed with gauze with frequent changing, keep surrounding skin dry, follow up with surgeon who did TAH and dermatologist. Patient verbalized understanding and agreement with plan.   Final Clinical Impressions(s) / ED Diagnoses   Final diagnoses:  Encounter for wound re-check    New Prescriptions Discharge Medication List as of 06/04/2017  7:22 PM     Lucila Maine, DO PGY-2, [facility] 06/04/2017 7:50 PM    Steve Rattler, DO 06/04/17  1950    Fatima Blank, MD 06/05/17 0007

## 2017-06-04 NOTE — ED Triage Notes (Signed)
Patient reports that she has had a wound to her umbilcal area x 4 weeks. Has been on 2 courses of antibiotics and it is not getting any better. PAtient reports that it is burning worse now

## 2018-11-29 ENCOUNTER — Encounter (HOSPITAL_BASED_OUTPATIENT_CLINIC_OR_DEPARTMENT_OTHER): Payer: Self-pay | Admitting: Emergency Medicine

## 2018-11-29 ENCOUNTER — Emergency Department (HOSPITAL_BASED_OUTPATIENT_CLINIC_OR_DEPARTMENT_OTHER)
Admission: EM | Admit: 2018-11-29 | Discharge: 2018-11-29 | Disposition: A | Discharge status: Home / Self Care | Payer: Self-pay | Attending: Emergency Medicine | Admitting: Emergency Medicine

## 2018-11-29 ENCOUNTER — Other Ambulatory Visit: Payer: Self-pay

## 2018-11-29 DIAGNOSIS — J111 Influenza due to unidentified influenza virus with other respiratory manifestations: Secondary | ICD-10-CM | POA: Insufficient documentation

## 2018-11-29 HISTORY — DX: Dermatitis, unspecified: L30.9

## 2018-11-29 MED ORDER — GUAIFENESIN ER 1200 MG PO TB12: 1.0000 | ORAL_TABLET | Freq: Two times a day (BID) | ORAL | 0 refills | Status: AC

## 2018-11-29 MED ORDER — PROMETHAZINE HCL 25 MG PO TABS: 25.0000 mg | ORAL_TABLET | Freq: Four times a day (QID) | ORAL | 0 refills | Status: AC | PRN

## 2018-11-29 MED ORDER — IBUPROFEN 800 MG PO TABS: 800.0000 mg | ORAL_TABLET | Freq: Three times a day (TID) | ORAL | 0 refills | Status: AC | PRN

## 2018-11-29 NOTE — ED Triage Notes (Signed)
Pt reports flu like symptoms since Monday. Body aches, chills no fevers. Denies sick contacts.

## 2018-11-29 NOTE — Discharge Instructions (Addendum)
Return here as needed.  Increase your fluid intake. 

## 2018-12-06 NOTE — ED Provider Notes (Signed)
[facility] EMERGENCY DEPARTMENT Provider Note   CSN: [id number] Arrival date & time: 11/29/18  1935    History   Chief Complaint Chief Complaint  Patient presents with  . Influenza    HPI Christina Holmes is a 48 y.o. female.     HPI Patient presents to the emergency department with cough, body aches, and chills over the last 5 days.  The patient states that nothing seems to make the condition better or worse.  Patient states that she took over-the-counter medications without significant relief of her symptoms.  The patient states she has had some nausea as well.  The patient denies chest pain, shortness of breath, headache,blurred vision, neck pain, fever,weakness, numbness, dizziness, anorexia, edema, abdominal pain,vomiting, diarrhea, rash, back pain, dysuria, hematemesis, bloody stool, near syncope, or syncope. Past Medical History:  Diagnosis Date  . Eczema   . Kidney calculi     Patient Active Problem List   Diagnosis Date Noted  . Kidney calculi     Past Surgical History:  Procedure Laterality Date  . ABDOMINAL HYSTERECTOMY       OB History   No obstetric history on file.      Home Medications    Prior to Admission medications   Medication Sig Start Date End Date Taking? Authorizing Provider  doxycycline (VIBRAMYCIN) 100 MG capsule Take 1 capsule (100 mg total) by mouth 2 (two) times daily. 05/12/17   Harris, Abigail, PA-C  fluticasone (FLONASE) 50 MCG/ACT nasal spray Place into both nostrils daily.    [provider]  Guaifenesin 1200 MG TB12 Take 1 tablet (1,200 mg total) by mouth 2 (two) times daily. 11/29/18   Tyshia Fenter, Harrell Gave, PA-C  ibuprofen (ADVIL,MOTRIN) 800 MG tablet Take 1 tablet (800 mg total) by mouth every 8 (eight) hours as needed. 11/29/18   Trude Cansler, Harrell Gave, PA-C  naproxen (NAPROSYN) 500 MG tablet Take 1 tablet (500 mg total) by mouth 2 (two) times daily with a meal. 08/07/16   Larene Pickett, PA-C  promethazine  (PHENERGAN) 25 MG tablet Take 1 tablet (25 mg total) by mouth every 6 (six) hours as needed for nausea or vomiting. 11/29/18   Ammi Hutt, Harrell Gave, PA-C  triamcinolone cream (KENALOG) 0.1 % Apply 1 application topically 2 (two) times daily. Do not apply to face 06/13/16   Horton, Barbette Hair, MD    Family History History reviewed. No pertinent family history.  Social History Social History   Tobacco Use  . Smoking status: Never Smoker  . Smokeless tobacco: Never Used  Substance Use Topics  . Alcohol use: No  . Drug use: No     Allergies   Patient has no known allergies.   Review of Systems Review of Systems All other systems negative except as documented in the HPI. All pertinent positives and negatives as reviewed in the HPI.  Physical Exam Updated Vital Signs BP 119/79 (BP Location: Left Arm)   Pulse 98   Temp 99.9 F (37.7 C) (Oral)   Resp 18   Ht 5\' 4"  (1.626 m)   Wt 72.6 kg   LMP 04/05/2011   SpO2 100%   BMI 27.46 kg/m   Physical Exam Vitals signs and nursing note reviewed.  Constitutional:      General: She is not in acute distress.    Appearance: She is well-developed.  HENT:     Head: Normocephalic and atraumatic.  Eyes:     Pupils: Pupils are equal, round, and reactive to light.  Neck:  Musculoskeletal: Normal range of motion and neck supple.  Cardiovascular:     Rate and Rhythm: Normal rate and regular rhythm.     Heart sounds: Normal heart sounds. No murmur. No friction rub. No gallop.   Pulmonary:     Effort: Pulmonary effort is normal. No respiratory distress.     Breath sounds: Normal breath sounds. No wheezing.  Abdominal:     General: Bowel sounds are normal. There is no distension.     Palpations: Abdomen is soft.     Tenderness: There is no abdominal tenderness.  Skin:    General: Skin is warm and dry.     Capillary Refill: Capillary refill takes less than 2 seconds.     Findings: No erythema or rash.  Neurological:     Mental  Status: She is alert and oriented to person, place, and time.     Motor: No abnormal muscle tone.     Coordination: Coordination normal.  Psychiatric:        Mood and Affect: Mood normal.        Behavior: Behavior normal.      ED Treatments / Results  Labs (all labs ordered are listed, but only abnormal results are displayed) Labs Reviewed - No data to display  EKG None  Radiology No results found.  Procedures Procedures (including critical care time)  Medications Ordered in ED Medications - No data to display   Initial Impression / Assessment and Plan / ED Course  I have reviewed the triage vital signs and the nursing notes.  Pertinent labs & imaging results that were available during my care of the patient were reviewed by me and considered in my medical decision making (see chart for details).        Patient be treated for an influenza-like illness.  Told to return here as needed.  Patient agrees the plan and all questions were answered.  Final Clinical Impressions(s) / ED Diagnoses   Final diagnoses:  Influenza    ED Discharge Orders         Ordered    ibuprofen (ADVIL,MOTRIN) 800 MG tablet  Every 8 hours PRN     11/29/18 2221    Guaifenesin 1200 MG TB12  2 times daily     11/29/18 2221    promethazine (PHENERGAN) 25 MG tablet  Every 6 hours PRN     11/29/18 2221           Dalia Heading, PA-C 12/06/18 0003    Fredia Sorrow, MD 12/09/18 [phone]

## 2019-06-13 ENCOUNTER — Emergency Department (HOSPITAL_COMMUNITY): Payer: Self-pay

## 2019-06-13 ENCOUNTER — Encounter (HOSPITAL_COMMUNITY): Payer: Self-pay

## 2019-06-13 ENCOUNTER — Emergency Department (HOSPITAL_COMMUNITY)
Admission: EM | Admit: 2019-06-13 | Discharge: 2019-06-13 | Disposition: A | Discharge status: Home / Self Care | Payer: Self-pay | Attending: Emergency Medicine | Admitting: Emergency Medicine

## 2019-06-13 DIAGNOSIS — M25511 Pain in right shoulder: Secondary | ICD-10-CM | POA: Insufficient documentation

## 2019-06-13 DIAGNOSIS — Z79899 Other long term (current) drug therapy: Secondary | ICD-10-CM | POA: Insufficient documentation

## 2019-06-13 MED ORDER — NAPROXEN 500 MG PO TABS: 500.0000 mg | ORAL_TABLET | Freq: Two times a day (BID) | ORAL | 0 refills | Status: DC

## 2019-06-13 MED ORDER — NAPROXEN 250 MG PO TABS
500.0000 mg | ORAL_TABLET | Freq: Once | ORAL | Status: AC
  Administered 2019-06-13: 500 mg via ORAL
  Filled 2019-06-13: qty 2

## 2019-06-13 MED ORDER — LIDOCAINE 5 % EX PTCH
1.0000 | MEDICATED_PATCH | CUTANEOUS | Status: DC
  Administered 2019-06-13: 1 via TRANSDERMAL
  Filled 2019-06-13: qty 1

## 2019-06-13 MED ORDER — METHOCARBAMOL 500 MG PO TABS: 500.0000 mg | ORAL_TABLET | Freq: Three times a day (TID) | ORAL | 0 refills | Status: AC | PRN

## 2019-06-13 NOTE — ED Provider Notes (Signed)
[place] EMERGENCY DEPARTMENT Provider Note   CSN: [id number] Arrival date & time: 06/13/19  0920     History   Chief Complaint Chief Complaint  Patient presents with  . Shoulder Pain    HPI Christina Holmes is a 48 y.o. female with a history of eczema who presents to the emergency department with complaints of right shoulder pain that has been progressively worsening over the past 4 weeks.  Patient states pain is located to the R shoulder, radiates down the arm into the hand, worse with movement, no alleviating factors. She states the arm feels stiff at times and like she cannot use it, not necessarily weak, but instead states just not right. She also mentions that she has intermittent paresthesias to the R hand- all digits. No specific trauma or change in activity she can recall. Denies neck pain, saddle anesthesia, incontinence to bowel/bladder, fever, chills, IV drug use, dysuria, or hx of cancer. No prior LUE/neck surgeries.  Denies any pain/paresthesias/weakness in her legs.     HPI  Past Medical History:  Diagnosis Date  . Eczema   . Kidney calculi     Patient Active Problem List   Diagnosis Date Noted  . Kidney calculi     Past Surgical History:  Procedure Laterality Date  . ABDOMINAL HYSTERECTOMY       OB History   No obstetric history on file.      Home Medications    Prior to Admission medications   Medication Sig Start Date End Date Taking? Authorizing Provider  doxycycline (VIBRAMYCIN) 100 MG capsule Take 1 capsule (100 mg total) by mouth 2 (two) times daily. 05/12/17   Harris, Abigail, PA-C  fluticasone (FLONASE) 50 MCG/ACT nasal spray Place into both nostrils daily.    [provider]  Guaifenesin 1200 MG TB12 Take 1 tablet (1,200 mg total) by mouth 2 (two) times daily. 11/29/18   Lawyer, Harrell Gave, PA-C  ibuprofen (ADVIL,MOTRIN) 800 MG tablet Take 1 tablet (800 mg total) by mouth every 8 (eight) hours as needed. 11/29/18    Lawyer, Harrell Gave, PA-C  naproxen (NAPROSYN) 500 MG tablet Take 1 tablet (500 mg total) by mouth 2 (two) times daily with a meal. 08/07/16   Larene Pickett, PA-C  promethazine (PHENERGAN) 25 MG tablet Take 1 tablet (25 mg total) by mouth every 6 (six) hours as needed for nausea or vomiting. 11/29/18   Lawyer, Harrell Gave, PA-C  triamcinolone cream (KENALOG) 0.1 % Apply 1 application topically 2 (two) times daily. Do not apply to face 06/13/16   Horton, Barbette Hair, MD    Family History No family history on file.  Social History Social History   Tobacco Use  . Smoking status: Never Smoker  . Smokeless tobacco: Never Used  Substance Use Topics  . Alcohol use: No  . Drug use: No     Allergies   Patient has no known allergies.   Review of Systems Review of Systems  Constitutional: Negative for chills and fever.  Eyes: Negative for visual disturbance.  Respiratory: Negative for shortness of breath.   Cardiovascular: Negative for chest pain.  Gastrointestinal: Negative for abdominal pain.  Musculoskeletal: Positive for arthralgias. Negative for back pain and neck pain.  Neurological: Negative for weakness and headaches.       Positive for left hand paresthesias.  Negative for incontinence or saddle anesthesia.     Physical Exam Updated Vital Signs BP 124/66 (BP Location: Left Arm)   Pulse 65  Temp 98.5 F (36.9 C) (Oral)   Resp 16   LMP 04/05/2011   SpO2 100%   Physical Exam Vitals signs and nursing note reviewed.  Constitutional:      General: She is not in acute distress.    Appearance: Normal appearance. She is not ill-appearing or toxic-appearing.  HENT:     Head: Normocephalic and atraumatic.  Neck:     Musculoskeletal: Normal range of motion and neck supple.     Comments: No midline tenderness.  Negative Spurling's. Cardiovascular:     Rate and Rhythm: Normal rate.     Pulses:          Radial pulses are 2+ on the right side and 2+ on the left side.   Pulmonary:     Effort: No respiratory distress.     Breath sounds: Normal breath sounds.  Musculoskeletal:     Comments: Upper extremities: No obvious deformity, appreciable swelling, edema, erythema, ecchymosis, warmth, or open wounds. Patient has intact AROM throughout-with the exception of right shoulder flexion/abduction, able to get to the point just past 90 degrees with each of these motions.  Tender to palpation to the right shoulder SITS muscles, trapezius, the diffuse glenohumeral joint, as well as the proximal two thirds of the humerus.  Upper extremities are otherwise nontender.  Compartments are soft.  Skin:    General: Skin is warm and dry.     Capillary Refill: Capillary refill takes less than 2 seconds.  Neurological:     Mental Status: She is alert.     Comments: Alert. Clear speech.  CN III through XII grossly intact.  Sensation grossly intact to bilateral upper and lower extremities. 5/5 strength with shoulder flexion/extension and elbow flexion/extension, and grip strength bilaterally.  5 out of 5 strength plantar dorsiflexion bilaterally.  Ambulatory.   Psychiatric:        Mood and Affect: Mood normal.        Behavior: Behavior normal.     ED Treatments / Results  Labs (all labs ordered are listed, but only abnormal results are displayed) Labs Reviewed - No data to display  EKG EKG Interpretation  Date/Time:  Saturday June 13 2019 09:29:27 EDT Ventricular Rate:  65 PR Interval:  152 QRS Duration: 82 QT Interval:  382 QTC Calculation: 397 R Axis:   80 Text Interpretation:  Normal sinus rhythm Cannot rule out Anterior infarct , age undetermined Abnormal ECG Confirmed by Gerlene Fee [phone]) on 06/13/2019 11:14:23 AM   Radiology Dg Shoulder Right  Result Date: 06/13/2019 CLINICAL DATA:  Right shoulder pain for the past 2 weeks. No injury. EXAM: RIGHT SHOULDER - 2+ VIEW COMPARISON:  None. FINDINGS: No acute fracture or dislocation. Mild acromioclavicular  osteoarthritis. The glenohumeral joint space is preserved. Bone mineralization is normal. Soft tissues are unremarkable. IMPRESSION: 1.  No acute osseous abnormality. 2. Mild acromioclavicular osteoarthritis. Electronically Signed   By: Titus Dubin M.D.   On: 06/13/2019 12:17    Procedures Procedures (including critical care time)  Medications Ordered in ED Medications  naproxen (NAPROSYN) tablet 500 mg (has no administration in time range)  lidocaine (LIDODERM) 5 % 1 patch (has no administration in time range)     Initial Impression / Assessment and Plan / ED Course  I have reviewed the triage vital signs and the nursing notes.  Pertinent labs & imaging results that were available during my care of the patient were reviewed by me and considered in my medical decision making (see chart  for details).   Patient presents to the emergency department with complaints of R shoulder pain. She states it sometimes stiffens up and is difficult to utilize but does not specify that it is weak, also has intermittent paresthesias to the hand.  She is nontoxic-appearing, no apparent distress, vitals WNL.  No fever, erythema, warmth, not consistent with septic joint.  She is relatively good range of motion, however she is limited with flexion/abduction to just past 90 degrees.  She is tender to palpation primarily over the shoulder and the SITS/trapezius muscle region.  She is neurovascularly intact distally.  No focal neurologic deficits, negative Spurling's, no evidence of cervical nerve root compression.  No lower extremity symptoms or deficits to raise concern for acute CVA.  EKG by triage no STEMI, no complaints of chest pain.  X-ray of the shoulder reveals mild acromioclavicular osteoarthritis, otherwise unremarkable.  Will trial naproxen/Robaxin and have patient follow-up closely with orthopedic surgery.  Discussed no driving/operating heavy machinery when taking Robaxin. I discussed results, treatment  plan, need for follow-up, and return precautions with the patient. Provided opportunity for questions, patient confirmed understanding and is in agreement with plan.   Findings and plan of care discussed with supervising physician Dr. Sedonia Small who has evaluated patient @ bedside and is in agreement.   Final Clinical Impressions(s) / ED Diagnoses   Final diagnoses:  Acute pain of right shoulder    ED Discharge Orders         Ordered    naproxen (NAPROSYN) 500 MG tablet  2 times daily     06/13/19 1319    methocarbamol (ROBAXIN) 500 MG tablet  Every 8 hours PRN     06/13/19 1319           Donielle Kaigler, Langston R, PA-C 06/13/19 1324    Maudie Flakes, MD 06/14/19 [phone]

## 2019-06-13 NOTE — ED Triage Notes (Signed)
Patient complains of 1 month of right arm numbness, states that it feels weak, denies injury. Reports a constant aching, NAD. No additional neuro deficits

## 2019-06-13 NOTE — ED Notes (Signed)
Pt requesting sling, stated "the doctor was going to give her one".  Asked Samantha, [place], she stated that she has already discussed reasons with pt for not prescribing one. Pt verbalized understanding.

## 2019-06-13 NOTE — Discharge Instructions (Addendum)
You were seen in the emergency department today for shoulder pain.  Your x-ray showed some arthritis but no acute abnormalities.  We are starting on the following medicines:  - Naproxen is a nonsteroidal anti-inflammatory medication that will help with pain and swelling. Be sure to take this medication as prescribed with food, 1 pill every 12 hours,  It should be taken with food, as it can cause stomach upset, and more seriously, stomach bleeding. Do not take other nonsteroidal anti-inflammatory medications with this such as Advil, Motrin, Aleve, Mobic, Goodie Powder, or Motrin.    - Robaxin is the muscle relaxer I have prescribed, this is meant to help with muscle tightness. Be aware that this medication may make you drowsy therefore the first time you take this it should be at a time you are in an environment where you can rest. Do not drive or operate heavy machinery when taking this medication. Do not drink alcohol or take other sedating medications with this medicine such as narcotics or benzodiazepines.   You make take Tylenol per over the counter dosing with these medications.   We have prescribed you new medication(s) today. Discuss the medications prescribed today with your pharmacist as they can have adverse effects and interactions with your other medicines including over the counter and prescribed medications. Seek medical evaluation if you start to experience new or abnormal symptoms after taking one of these medicines, seek care immediately if you start to experience difficulty breathing, feeling of your throat closing, facial swelling, or rash as these could be indications of a more serious allergic reaction     We would like you to follow-up closely with an orthopedic surgeon, please call the office your discharge instructions on Monday to make an appointment within the next 1 week.  Return to the ER for new or worsening symptoms including but not limited to worsening pain, inability to  move the right arm, numbness of the arm, numbness or weakness of other extremities, redness, fever, chills, or any other concern.

## 2019-06-24 ENCOUNTER — Ambulatory Visit: Payer: Self-pay | Admitting: Orthopaedic Surgery

## 2019-06-30 ENCOUNTER — Encounter: Payer: Self-pay | Admitting: Orthopaedic Surgery

## 2019-06-30 ENCOUNTER — Ambulatory Visit (INDEPENDENT_AMBULATORY_CARE_PROVIDER_SITE_OTHER): Payer: BLUE CROSS/BLUE SHIELD | Admitting: Orthopaedic Surgery

## 2019-06-30 DIAGNOSIS — M19019 Primary osteoarthritis, unspecified shoulder: Secondary | ICD-10-CM | POA: Diagnosis not present

## 2019-06-30 MED ORDER — MELOXICAM 7.5 MG PO TABS: 7.5000 mg | ORAL_TABLET | Freq: Every day | ORAL | 2 refills | Status: AC | PRN

## 2019-06-30 NOTE — Progress Notes (Signed)
   Office Visit Note   Patient: Christina Holmes           Date of Birth: 11-13-1970           MRN: [id number] Visit Date: 06/30/2019              Requested by: No referring provider defined for this encounter. PCP: Patient, No Pcp Per   Assessment & Plan: Visit Diagnoses:  1. AC joint arthropathy     Plan: Impression is right shoulder AC joint arthropathy.  Discussed having Dr. Junius Roads inject this with cortisone under ultrasound today.  Patient politely declined and would rather try a course of oral anti-inflammatories first.  She will follow-up with [place] as needed.  Follow-Up Instructions: Return if symptoms worsen or fail to improve.   Orders:  No orders of the defined types were placed in this encounter.  Meds ordered this encounter  Medications  . meloxicam (MOBIC) 7.5 MG tablet    Sig: Take 1 tablet (7.5 mg total) by mouth daily as needed for up to 14 doses for pain.    Dispense:  30 tablet    Refill:  2      Procedures: No procedures performed   Clinical Data: No additional findings.   Subjective: Chief Complaint  Patient presents with  . Right Shoulder - Pain    HPI patient is a pleasant 48 year old female who presents our clinic today with right shoulder pain.  This began approximately 2 months ago without any known injury or change in activity.  Pain she has is to the top of her shoulder and radiates down her arm.  She has increased pain sleeping on the right side.  She has not taken any medication for this.  No previous cortisone injection to the shoulder.  Review of Systems as detailed in HPI.  All others reviewed and are negative.   Objective: Vital Signs: LMP 04/05/2011   Physical Exam well-developed well-nourished female no acute distress.  Alert and oriented x3.  Ortho Exam examination of the right shoulder reveals marked tenderness to the [facility] joint.  She has near full active range of motion in all planes.  She can internally rotate to T12.  Positive  empty can and positive cross body abduction.  No focal weakness.  She is neurovascular intact distally.  Specialty Comments:  No specialty comments available.  Imaging: X-rays reviewed by me in canopy reveal mild to moderate degenerative changes to the [facility] joint.  Otherwise, no acute findings   PMFS History: Patient Active Problem List   Diagnosis Date Noted  . Kidney calculi    Past Medical History:  Diagnosis Date  . Eczema   . Kidney calculi     No family history on file.  Past Surgical History:  Procedure Laterality Date  . ABDOMINAL HYSTERECTOMY     Social History   Occupational History  . Not on file  Tobacco Use  . Smoking status: Never Smoker  . Smokeless tobacco: Never Used  Substance and Sexual Activity  . Alcohol use: No  . Drug use: No  . Sexual activity: Never    Birth control/protection: Surgical

## 2019-08-28 ENCOUNTER — Encounter (HOSPITAL_BASED_OUTPATIENT_CLINIC_OR_DEPARTMENT_OTHER): Payer: Self-pay | Admitting: *Deleted

## 2019-08-28 ENCOUNTER — Emergency Department (HOSPITAL_BASED_OUTPATIENT_CLINIC_OR_DEPARTMENT_OTHER): Payer: BLUE CROSS/BLUE SHIELD

## 2019-08-28 ENCOUNTER — Emergency Department (HOSPITAL_BASED_OUTPATIENT_CLINIC_OR_DEPARTMENT_OTHER)
Admission: EM | Admit: 2019-08-28 | Discharge: 2019-08-29 | Disposition: A | Discharge status: Home / Self Care | Payer: BLUE CROSS/BLUE SHIELD | Attending: Emergency Medicine | Admitting: Emergency Medicine

## 2019-08-28 ENCOUNTER — Other Ambulatory Visit: Payer: Self-pay

## 2019-08-28 DIAGNOSIS — J189 Pneumonia, unspecified organism: Secondary | ICD-10-CM | POA: Insufficient documentation

## 2019-08-28 DIAGNOSIS — Z20828 Contact with and (suspected) exposure to other viral communicable diseases: Secondary | ICD-10-CM | POA: Insufficient documentation

## 2019-08-28 DIAGNOSIS — R05 Cough: Secondary | ICD-10-CM | POA: Diagnosis present

## 2019-08-28 DIAGNOSIS — R1084 Generalized abdominal pain: Secondary | ICD-10-CM | POA: Insufficient documentation

## 2019-08-28 LAB — COMPREHENSIVE METABOLIC PANEL
ALT: 19 U/L (ref 0–44)
AST: 20 U/L (ref 15–41)
Albumin: 3.7 g/dL (ref 3.5–5.0)
Alkaline Phosphatase: 50 U/L (ref 38–126)
Anion gap: 11 (ref 5–15)
BUN: <5 mg/dL — ABNORMAL LOW (ref 6–20)
CO2: 27 mmol/L (ref 22–32)
Calcium: 8.4 mg/dL — ABNORMAL LOW (ref 8.9–10.3)
Chloride: 99 mmol/L (ref 98–111)
Creatinine, Ser: 0.66 mg/dL (ref 0.44–1.00)
GFR calc Af Amer: >60 mL/min (ref >60)
GFR calc non Af Amer: >60 mL/min (ref >60)
Glucose, Bld: 95 mg/dL (ref 70–99)
Potassium: 3.1 mmol/L — ABNORMAL LOW (ref 3.5–5.1)
Sodium: 137 mmol/L (ref 135–145)
Total Bilirubin: 0.8 mg/dL (ref 0.3–1.2)
Total Protein: 8.3 g/dL — ABNORMAL HIGH (ref 6.5–8.1)

## 2019-08-28 LAB — CBC WITH DIFFERENTIAL/PLATELET
Abs Immature Granulocytes: 0.01 10*3/uL (ref 0.00–0.07)
Basophils Absolute: 0 10*3/uL (ref 0.0–0.1)
Basophils Relative: 0 %
Eosinophils Absolute: 0.1 10*3/uL (ref 0.0–0.5)
Eosinophils Relative: 3 %
HCT: 36.6 % (ref 36.0–46.0)
Hemoglobin: 11.5 g/dL — ABNORMAL LOW (ref 12.0–15.0)
Immature Granulocytes: 0 %
Lymphocytes Relative: 36 %
Lymphs Abs: 1.5 10*3/uL (ref 0.7–4.0)
MCH: 27.6 pg (ref 26.0–34.0)
MCHC: 31.4 g/dL (ref 30.0–36.0)
MCV: 88 fL (ref 80.0–100.0)
Monocytes Absolute: 0.3 10*3/uL (ref 0.1–1.0)
Monocytes Relative: 8 %
Neutro Abs: 2.2 10*3/uL (ref 1.7–7.7)
Neutrophils Relative %: 53 %
Platelets: 331 10*3/uL (ref 150–400)
RBC: 4.16 MIL/uL (ref 3.87–5.11)
RDW: 12.3 % (ref 11.5–15.5)
Smear Review: NORMAL
WBC: 4.2 10*3/uL (ref 4.0–10.5)
nRBC: 0 % (ref 0.0–0.2)

## 2019-08-28 LAB — URINALYSIS, MICROSCOPIC (REFLEX)

## 2019-08-28 LAB — URINALYSIS, ROUTINE W REFLEX MICROSCOPIC
Glucose, UA: NEGATIVE mg/dL
Hgb urine dipstick: NEGATIVE
Ketones, ur: 15 mg/dL — AB
Nitrite: NEGATIVE
Protein, ur: 30 mg/dL — AB
Specific Gravity, Urine: >1.03 — ABNORMAL HIGH (ref 1.005–1.030)
pH: 6 (ref 5.0–8.0)

## 2019-08-28 LAB — PREGNANCY, URINE: Preg Test, Ur: NEGATIVE

## 2019-08-28 LAB — SARS CORONAVIRUS 2 AG (30 MIN TAT): SARS Coronavirus 2 Ag: NEGATIVE

## 2019-08-28 MED ORDER — ONDANSETRON HCL 4 MG/2ML IJ SOLN
4.0000 mg | Freq: Once | INTRAMUSCULAR | Status: AC
  Administered 2019-08-28: 4 mg via INTRAVENOUS
  Filled 2019-08-28: qty 2

## 2019-08-28 MED ORDER — BENZONATATE 100 MG PO CAPS
200.0000 mg | ORAL_CAPSULE | Freq: Once | ORAL | Status: AC
  Administered 2019-08-28: 200 mg via ORAL
  Filled 2019-08-28: qty 2

## 2019-08-28 MED ORDER — SODIUM CHLORIDE 0.9 % IV BOLUS
1000.0000 mL | Freq: Once | INTRAVENOUS | Status: AC
  Administered 2019-08-28: 1000 mL via INTRAVENOUS

## 2019-08-28 MED ORDER — DEXAMETHASONE SODIUM PHOSPHATE 10 MG/ML IJ SOLN
10.0000 mg | Freq: Once | INTRAMUSCULAR | Status: AC
  Administered 2019-08-28: 10 mg via INTRAVENOUS
  Filled 2019-08-28: qty 1

## 2019-08-28 MED ORDER — IOHEXOL 300 MG/ML  SOLN
100.0000 mL | Freq: Once | INTRAMUSCULAR | Status: AC | PRN
  Administered 2019-08-28: 100 mL via INTRAVENOUS

## 2019-08-28 NOTE — ED Notes (Signed)
Patient transported to CT 

## 2019-08-28 NOTE — Discharge Instructions (Addendum)
Your symptoms are likely consistent with a viral illness, such as COVID-19. Viruses do not require or respond to antibiotics, however, this pattern of pneumonia on the chest imaging can also be associated with an atypical bacterial infection.  For this reason, we will initiate a course of antibiotics.  Treatment is otherwise symptomatic care and it is important to note that these symptoms may last for 7-14 days.   Hand washing: Wash your hands throughout the day, but especially before and after touching the face, using the restroom, sneezing, coughing, or touching surfaces that have been coughed or sneezed upon. Hydration: Symptoms of most illnesses will be intensified and complicated by dehydration. Dehydration can also extend the duration of symptoms. Drink plenty of fluids and get plenty of rest. You should be drinking at least half a liter of water an hour to stay hydrated. Electrolyte drinks (ex. Gatorade, Powerade, Pedialyte) are also encouraged. You should be drinking enough fluids to make your urine light yellow, almost clear. If this is not the case, you are not drinking enough water. Please note that some of the treatments indicated below will not be effective if you are not adequately hydrated. Diet: Please concentrate on hydration, however, you may introduce food slowly.  Start with a clear liquid diet, progressed to a full liquid diet, and then bland solids as you are able. Pain or fever: Ibuprofen, Naproxen, or acetaminophen (generic for Tylenol) for pain or fever.  Antiinflammatory medications: Take 600 mg of ibuprofen every 6 hours or 440 mg (over the counter dose) to 500 mg (prescription dose) of naproxen every 12 hours for the next 3 days. After this time, these medications may be used as needed for pain. Take these medications with food to avoid upset stomach. Choose only one of these medications, do not take them together. Acetaminophen (generic for Tylenol): Should you continue to have  additional pain while taking the ibuprofen or naproxen, you may add in acetaminophen as needed. Your daily total maximum amount of acetaminophen from all sources should be limited to 4000mg /day for persons without liver problems, or 2000mg /day for those with liver problems. Nausea/vomiting: Use the ondansetron (generic for Zofran) for nausea or vomiting.  This medication may not prevent all vomiting or nausea, but can help facilitate better hydration. Things that can help with nausea/vomiting also include peppermint/menthol candies, vitamin B12, and ginger. Diarrhea: May use medications such as loperamide (Imodium) or Bismuth subsalicylate (Pepto-Bismol). Cough: Use the benzonatate (generic for Tessalon) for cough.  Teas, warm liquids, broths, and honey can also help with cough. Alternatively, may use the hydrocodone-based cough syrup for severe cough.  This medication is quite powerful and should not be used more frequently than every 12 hours. Albuterol: May use the albuterol as needed for instances of shortness of breath. Zyrtec or Claritin: May add these medication daily to control underlying symptoms of congestion, sneezing, and other signs of allergies.  These medications are available over-the-counter. Generics: Cetirizine (generic for Zyrtec) and loratadine (generic for Claritin). Fluticasone: Use fluticasone (generic for Flonase), as directed, for nasal and sinus congestion.  This medication is available over-the-counter. Congestion: Plain guaifenesin (generic for plain Mucinex) may help relieve congestion. Saline sinus rinses and saline nasal sprays may also help relieve congestion. If you do not have high blood pressure, heart problems, or an allergy to such medications, you may also try phenylephrine or Sudafed. Sore throat: Warm liquids or Chloraseptic spray may help soothe a sore throat. Gargle twice a day with a salt water  solution made from a half teaspoon of salt in a cup of warm water.    Follow up: Follow up with a primary care provider within the next two weeks should symptoms fail to resolve. Return: Return to the ED for significantly worsening symptoms, shortness of breath, persistent vomiting, large amounts of blood in stool, or any other major concerns.  For prescription assistance, may try using prescription discount sites or apps, such as goodrx.com  COVID-19 isolation recommendations  Patients who have symptoms consistent with COVID-19 should self isolate until: At least 3 days (72 hours) have passed since recovery, defined as resolution of fever without the use of fever reducing medications and improvement in respiratory symptoms (e.g., cough, shortness of breath), and At least 7 days have passed since symptoms first appeared. Retesting is not required and not recommended as patients can continue to test positive for several weeks despite lack of symptoms.

## 2019-08-28 NOTE — ED Triage Notes (Addendum)
Pt c/ cough, fever chills and body aches, SOB n/d x 3 weeks

## 2019-08-28 NOTE — ED Provider Notes (Signed)
[facility] EMERGENCY DEPARTMENT Provider Note   CSN: [id number] Arrival date & time: 08/28/19  1752     History   Chief Complaint Chief Complaint  Patient presents with  . Cough    HPI Christina Holmes is a 48 y.o. female.     HPI   Christina Holmes is a 48 y.o. female, with a history of hysterectomy, presenting to the ED with cough for the past 3 weeks.  Accompanied by shortness of breath, body aches, chills, nausea, vomiting, and abdominal pain.  Describes her abdominal pain as soreness after vomiting. Denies fever, hematochezia/melena, diarrhea, urinary symptoms, chest pain, or any other complaints.      Past Medical History:  Diagnosis Date  . Eczema   . Kidney calculi     Patient Active Problem List   Diagnosis Date Noted  . Kidney calculi     Past Surgical History:  Procedure Laterality Date  . ABDOMINAL HYSTERECTOMY       OB History   No obstetric history on file.      Home Medications    Prior to Admission medications   Medication Sig Start Date End Date Taking? Authorizing Provider  azithromycin (ZITHROMAX) 250 MG tablet Take 1 tablet (250 mg total) by mouth daily. Take first 2 tablets together, then 1 every day until finished. 08/29/19   Shamar Kracke C, PA-C  benzonatate (TESSALON) 100 MG capsule Take 1 capsule (100 mg total) by mouth every 8 (eight) hours. 08/29/19   Ruston Fedora C, PA-C  dicyclomine (BENTYL) 20 MG tablet Take 1 tablet (20 mg total) by mouth 2 (two) times daily. 08/29/19   Sapir Lavey C, PA-C  doxycycline (VIBRAMYCIN) 100 MG capsule Take 1 capsule (100 mg total) by mouth 2 (two) times daily. 05/12/17   Harris, Abigail, PA-C  Guaifenesin 1200 MG TB12 Take 1 tablet (1,200 mg total) by mouth 2 (two) times daily. 11/29/18   Lawyer, Harrell Gave, PA-C  HYDROcodone-Chlorpheniramine 5-4 MG/5ML SOLN Take 5 mLs by mouth every 12 (twelve) hours as needed (Severe cough). 08/29/19   Janaki Exley C, PA-C  ibuprofen (ADVIL,MOTRIN) 800 MG tablet  Take 1 tablet (800 mg total) by mouth every 8 (eight) hours as needed. 11/29/18   Lawyer, Harrell Gave, PA-C  meloxicam (MOBIC) 7.5 MG tablet Take 1 tablet (7.5 mg total) by mouth daily as needed for up to 14 doses for pain. 06/30/19   Aundra Dubin, PA-C  methocarbamol (ROBAXIN) 500 MG tablet Take 1 tablet (500 mg total) by mouth every 8 (eight) hours as needed for muscle spasms. 06/13/19   Petrucelli, Samantha R, PA-C  naproxen (NAPROSYN) 500 MG tablet Take 1 tablet (500 mg total) by mouth 2 (two) times daily. 06/13/19   Petrucelli, Samantha R, PA-C  ondansetron (ZOFRAN ODT) 4 MG disintegrating tablet Take 1 tablet (4 mg total) by mouth every 8 (eight) hours as needed for nausea or vomiting. 08/29/19   Chauna Osoria C, PA-C  promethazine (PHENERGAN) 25 MG tablet Take 1 tablet (25 mg total) by mouth every 6 (six) hours as needed for nausea or vomiting. 11/29/18   Dalia Heading, PA-C    Family History History reviewed. No pertinent family history.  Social History Social History   Tobacco Use  . Smoking status: Never Smoker  . Smokeless tobacco: Never Used  Substance Use Topics  . Alcohol use: No  . Drug use: No     Allergies   Patient has no known allergies.   Review of Systems Review  of Systems  Constitutional: Positive for chills and fatigue. Negative for fever.  Respiratory: Positive for cough and shortness of breath.   Cardiovascular: Negative for chest pain and leg swelling.  Gastrointestinal: Positive for abdominal pain, nausea and vomiting. Negative for diarrhea.  Genitourinary: Negative for dysuria, flank pain and hematuria.  Neurological: Positive for weakness.  All other systems reviewed and are negative.    Physical Exam Updated Vital Signs BP 117/72   Pulse 84   Temp 98.8 F (37.1 C) (Oral)   Resp 20   Ht 5\' 4"  (1.626 m)   Wt 81.6 kg   LMP 04/05/2011   SpO2 100%   BMI 30.90 kg/m   Physical Exam Vitals signs and nursing note reviewed.  Constitutional:       General: She is not in acute distress.    Appearance: She is well-developed. She is not diaphoretic.  HENT:     Head: Normocephalic and atraumatic.     Mouth/Throat:     Mouth: Mucous membranes are moist.     Pharynx: Oropharynx is clear.  Eyes:     Conjunctiva/sclera: Conjunctivae normal.  Neck:     Musculoskeletal: Neck supple.  Cardiovascular:     Rate and Rhythm: Normal rate and regular rhythm.     Pulses: Normal pulses.          Radial pulses are 2+ on the right side and 2+ on the left side.       Posterior tibial pulses are 2+ on the right side and 2+ on the left side.     Heart sounds: Normal heart sounds.     Comments: Tactile temperature in the extremities appropriate and equal bilaterally. Pulmonary:     Effort: Pulmonary effort is normal. No respiratory distress.     Breath sounds: Normal breath sounds.     Comments: No increased work of breathing.  Speaks in full sentences without noted difficulty.  Patient does have frequent coughing, however. Abdominal:     Palpations: Abdomen is soft.     Tenderness: There is generalized abdominal tenderness. There is no guarding.  Musculoskeletal:     Right lower leg: No edema.     Left lower leg: No edema.  Lymphadenopathy:     Cervical: No cervical adenopathy.  Skin:    General: Skin is warm and dry.  Neurological:     Mental Status: She is alert.  Psychiatric:        Mood and Affect: Mood and affect normal.        Speech: Speech normal.        Behavior: Behavior normal.      ED Treatments / Results  Labs (all labs ordered are listed, but only abnormal results are displayed) Labs Reviewed  CBC WITH DIFFERENTIAL/PLATELET - Abnormal; Notable for the following components:      Result Value   Hemoglobin 11.5 (*)    All other components within normal limits  COMPREHENSIVE METABOLIC PANEL - Abnormal; Notable for the following components:   Potassium 3.1 (*)    BUN <5 (*)    Calcium 8.4 (*)    Total Protein 8.3 (*)     All other components within normal limits  URINALYSIS, ROUTINE W REFLEX MICROSCOPIC - Abnormal; Notable for the following components:   APPearance CLOUDY (*)    Specific Gravity, Urine >1.030 (*)    Bilirubin Urine MODERATE (*)    Ketones, ur 15 (*)    Protein, ur 30 (*)    Leukocytes,Ua  TRACE (*)    All other components within normal limits  URINALYSIS, MICROSCOPIC (REFLEX) - Abnormal; Notable for the following components:   Bacteria, UA RARE (*)    All other components within normal limits  SARS CORONAVIRUS 2 AG (30 MIN TAT)  NOVEL CORONAVIRUS, NAA (HOSP ORDER, SEND-OUT TO REF LAB; TAT 18-24 HRS)  PREGNANCY, URINE    EKG None  Radiology Ct Abdomen Pelvis W Contrast  Result Date: 08/29/2019 CLINICAL DATA:  Abdominal pain, acute EXAM: CT ABDOMEN AND PELVIS WITH CONTRAST TECHNIQUE: Multidetector CT imaging of the abdomen and pelvis was performed using the standard protocol following bolus administration of intravenous contrast. CONTRAST:  139mL OMNIPAQUE IOHEXOL 300 MG/ML  SOLN COMPARISON:  None. FINDINGS: Lower chest: The visualized heart size within normal limits. No pericardial fluid/thickening. There is patchy rounded airspace opacities seen throughout both lung bases. Hepatobiliary: The liver is normal in density without focal abnormality.The main portal vein is patent. No evidence of calcified gallstones, gallbladder wall thickening or biliary dilatation. Pancreas: Unremarkable. No pancreatic ductal dilatation or surrounding inflammatory changes. Spleen: Normal in size without focal abnormality. Adrenals/Urinary Tract: Both adrenal glands appear normal. The kidneys and collecting system appear normal without evidence of urinary tract calculus or hydronephrosis. Bladder is unremarkable. Stomach/Bowel: The small bowel and stomach are normal in appearance. A moderate amount of colonic stool is present. Colonic diverticula are noted without diverticulitis. Vascular/Lymphatic: There are no  enlarged mesenteric, retroperitoneal, or pelvic lymph nodes. A small amount of free fluid is seen within the deep pelvis. No significant vascular findings are present. Reproductive: The uterus and adnexa are unremarkable. Other: A small fat and bowel containing umbilical hernia is seen. No evidence of bowel strangulation. Musculoskeletal: No acute or significant osseous findings. IMPRESSION: Bilateral patchy airspace opacities throughout both lung bases. These findings are concerning for atypical multifocal pneumonia. Small amount pelvic ascites. Moderate amount of colonic stool. Electronically Signed   By: Prudencio Pair M.D.   On: 08/29/2019 00:00   Dg Chest Portable 1 View  Result Date: 08/28/2019 CLINICAL DATA:  Cough.  Fever and chills. EXAM: PORTABLE CHEST 1 VIEW COMPARISON:  July 28, 2018 FINDINGS: There is subtle infiltrate in the left base. The heart, hila, mediastinum, lungs, and pleura are otherwise normal. IMPRESSION: Left basilar infiltrate with a somewhat peripheral predilection. The findings are consistent with pneumonia. Atypical infections should be considered. Electronically Signed   By: Dorise Bullion III M.D   On: 08/28/2019 23:21    Procedures Procedures (including critical care time)  Medications Ordered in ED Medications  sodium chloride 0.9 % bolus 1,000 mL ( Intravenous Stopped 08/29/19 0039)  ondansetron (ZOFRAN) injection 4 mg (4 mg Intravenous Given 08/28/19 2309)  benzonatate (TESSALON) capsule 200 mg (200 mg Oral Given 08/28/19 2311)  dexamethasone (DECADRON) injection 10 mg (10 mg Intravenous Given 08/28/19 2309)  iohexol (OMNIPAQUE) 300 MG/ML solution 100 mL (100 mLs Intravenous Contrast Given 08/28/19 2339)  chlorpheniramine-HYDROcodone (TUSSIONEX) 10-8 MG/5ML suspension 5 mL (5 mLs Oral Given 08/29/19 0040)  albuterol (VENTOLIN HFA) 108 (90 Base) MCG/ACT inhaler 2 puff (2 puffs Inhalation Given 08/29/19 0043)  azithromycin (ZITHROMAX) tablet 500 mg (500 mg Oral Given  08/29/19 0039)     Initial Impression / Assessment and Plan / ED Course  I have reviewed the triage vital signs and the nursing notes.  Pertinent labs & imaging results that were available during my care of the patient were reviewed by me and considered in my medical decision making (see chart for details).  Patient presents with shortness of breath, cough, nausea, vomiting. Patient is nontoxic appearing, afebrile, not tachycardic, not tachypneic, not hypotensive, maintains excellent SPO2 on room air, and is in no apparent distress.  Ketonuria, elevated specific gravity, and possible bilirubin in the urine suggest dehydration.  This was addressed with IV fluids. Evidence of multifocal pneumonia in the bilateral lung bases on abdominal CT.  Although POC Covid test is negative, there is a high suspicion for COVID-19 infection in this patient.  Findings and plan of care discussed with Antony Blackbird, MD.   At the end of my shift, Dr. Florina Ou will assure patient ambulates while maintaining adequate SPO2 on room air.  Vitals:   08/28/19 2209 08/28/19 2322 08/29/19 0014 08/29/19 0048  BP: 104/69  108/66   Pulse: 88  67   Resp: 18  20   Temp: 98.6 F (37 C) 98.2 F (36.8 C)    TempSrc: Oral Oral    SpO2: 100%  100% 100%  Weight:      Height:        Christina Holmes was evaluated in Emergency Department on 08/29/2019 for the symptoms described in the history of present illness. She was evaluated in the context of the global COVID-19 pandemic, which necessitated consideration that the patient might be at risk for infection with the SARS-CoV-2 virus that causes COVID-19. Institutional protocols and algorithms that pertain to the evaluation of patients at risk for COVID-19 are in a state of rapid change based on information released by regulatory bodies including the CDC and federal and state organizations. These policies and algorithms were followed during the patient's care in the ED.   Final Clinical Impressions(s) / ED Diagnoses   Final diagnoses:  Multifocal pneumonia    ED Discharge Orders         Ordered    benzonatate (TESSALON) 100 MG capsule  Every 8 hours     08/29/19 0008    ondansetron (ZOFRAN ODT) 4 MG disintegrating tablet  Every 8 hours PRN     08/29/19 0008    dicyclomine (BENTYL) 20 MG tablet  2 times daily     08/29/19 0008    azithromycin (ZITHROMAX) 250 MG tablet  Daily     08/29/19 0022    HYDROcodone-Chlorpheniramine 5-4 MG/5ML SOLN  Every 12 hours PRN     08/29/19 0056           Lorayne Bender, PA-C 08/29/19 0105    Tegeler, Gwenyth Allegra, MD 08/29/19 1415

## 2019-08-29 MED ORDER — BENZONATATE 100 MG PO CAPS: 100.0000 mg | ORAL_CAPSULE | Freq: Three times a day (TID) | ORAL | 0 refills | Status: AC

## 2019-08-29 MED ORDER — HYDROCODONE-CHLORPHENIRAMINE 5-4 MG/5ML PO SOLN: 5.0000 mL | Freq: Two times a day (BID) | ORAL | 0 refills | Status: AC | PRN

## 2019-08-29 MED ORDER — POTASSIUM CHLORIDE CRYS ER 20 MEQ PO TBCR
40.0000 meq | EXTENDED_RELEASE_TABLET | Freq: Once | ORAL | Status: AC
  Administered 2019-08-29: 40 meq via ORAL
  Filled 2019-08-29: qty 2

## 2019-08-29 MED ORDER — AZITHROMYCIN 250 MG PO TABS: 250.0000 mg | ORAL_TABLET | Freq: Every day | ORAL | 0 refills | Status: AC

## 2019-08-29 MED ORDER — ONDANSETRON 4 MG PO TBDP: 4.0000 mg | ORAL_TABLET | Freq: Three times a day (TID) | ORAL | 0 refills | Status: AC | PRN

## 2019-08-29 MED ORDER — AZITHROMYCIN 250 MG PO TABS
500.0000 mg | ORAL_TABLET | Freq: Once | ORAL | Status: AC
  Administered 2019-08-29: 01:00:00 500 mg via ORAL
  Filled 2019-08-29: qty 2

## 2019-08-29 MED ORDER — ALBUTEROL SULFATE HFA 108 (90 BASE) MCG/ACT IN AERS
2.0000 | INHALATION_SPRAY | Freq: Once | RESPIRATORY_TRACT | Status: AC
  Administered 2019-08-29: 2 via RESPIRATORY_TRACT
  Filled 2019-08-29: qty 6.7

## 2019-08-29 MED ORDER — DICYCLOMINE HCL 20 MG PO TABS: 20.0000 mg | ORAL_TABLET | Freq: Two times a day (BID) | ORAL | 0 refills | Status: AC

## 2019-08-29 MED ORDER — HYDROCOD POLST-CPM POLST ER 10-8 MG/5ML PO SUER
5.0000 mL | Freq: Once | ORAL | Status: AC
  Administered 2019-08-29: 5 mL via ORAL
  Filled 2019-08-29: qty 5

## 2019-09-01 LAB — NOVEL CORONAVIRUS, NAA (HOSP ORDER, SEND-OUT TO REF LAB; TAT 18-24 HRS): SARS-CoV-2, NAA: NOT DETECTED

## 2019-09-11 ENCOUNTER — Encounter: Payer: Self-pay | Admitting: Gastroenterology

## 2019-10-01 ENCOUNTER — Encounter: Payer: Self-pay | Admitting: *Deleted

## 2019-10-07 ENCOUNTER — Ambulatory Visit: Payer: BLUE CROSS/BLUE SHIELD | Admitting: Gastroenterology

## 2020-09-13 IMAGING — DX DG CHEST 1V PORT
1 series · 1 of 1 positions shown · non-contrast
Comparison: July 28, 2018

CLINICAL DATA: Cough.  Fever and chills.

EXAM:
PORTABLE CHEST 1 VIEW

[chest ap]
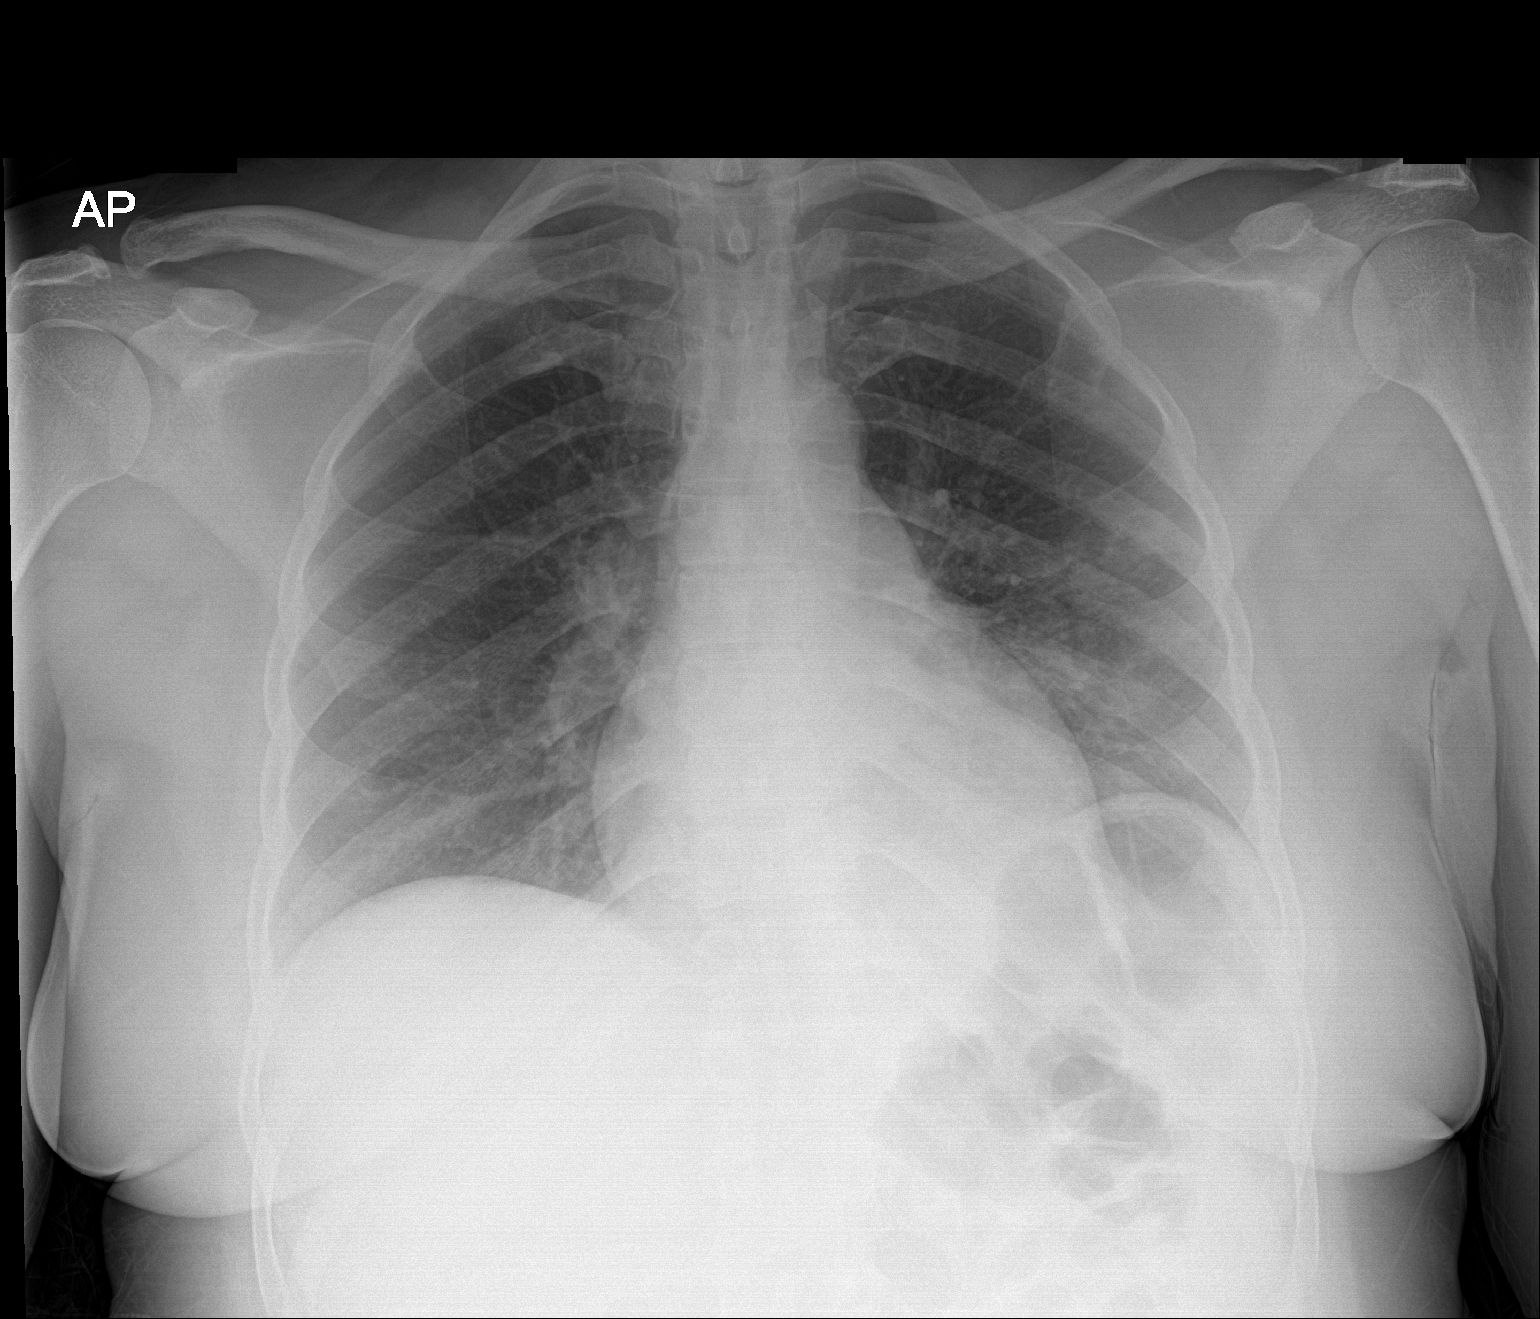

[1 of 1 positions shown; findings below may reference images not displayed]

FINDINGS: There is subtle infiltrate in the left base. The heart, hila,
mediastinum, lungs, and pleura are otherwise normal.
IMPRESSION: Left basilar infiltrate with a somewhat peripheral predilection. The
findings are consistent with pneumonia. Atypical infections should
be considered.

## 2020-09-13 IMAGING — CT CT ABD-PELV W/ CM
2 of 5 series · 16 of 46 positions shown, 18 images · IV contrast (Omnipaque)
Comparison: None.

CLINICAL DATA: Abdominal pain, acute

EXAM:
CT ABDOMEN AND PELVIS WITH CONTRAST
TECHNIQUE: Multidetector CT imaging of the abdomen and pelvis was performed
using the standard protocol following bolus administration of
intravenous contrast.
CONTRAST:  100mL OMNIPAQUE IOHEXOL 300 MG/ML  SOLN

[Series 2: axial st · axial · 0.68mm/px · z∈[+854,+1214]mm · 13 of 81 slices shown, 15 images]
[im 5/81  soft-tissue]
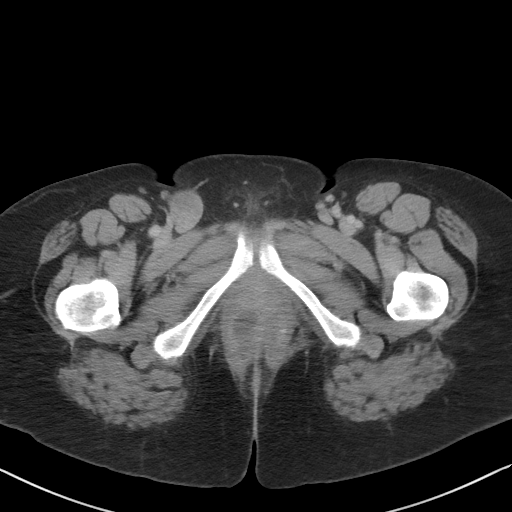
[im 5/81  bone]
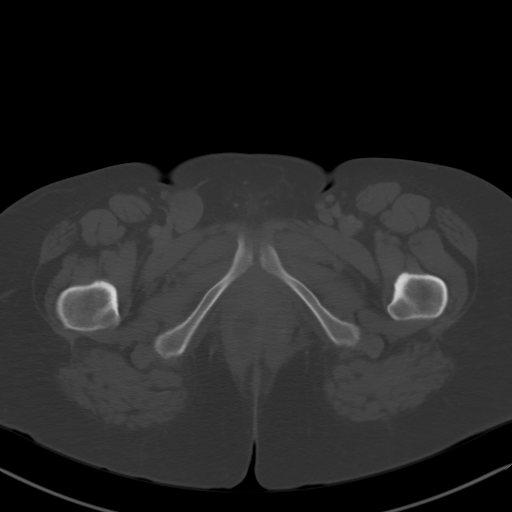
[im 13/81  soft-tissue]
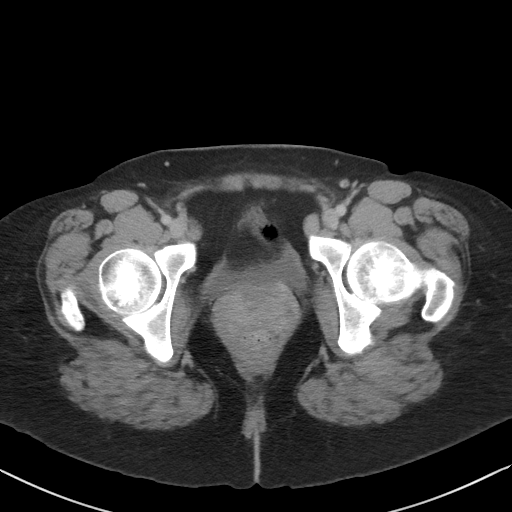
[im 17/81  soft-tissue]
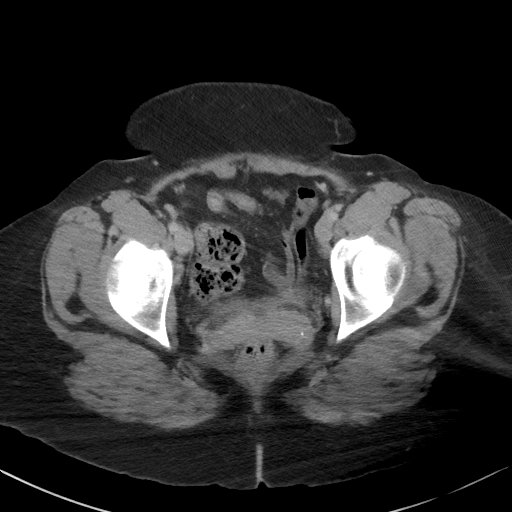
[im 25/81  soft-tissue]
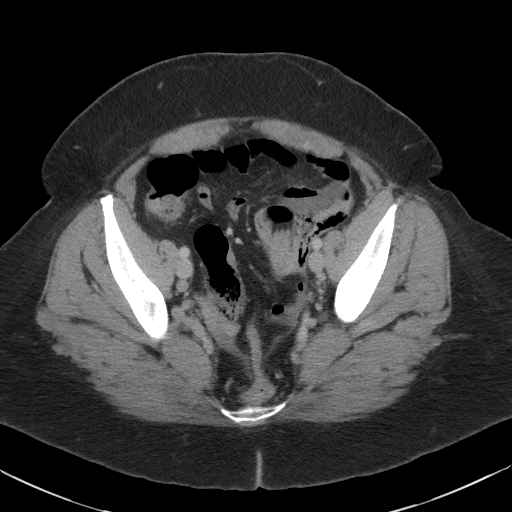
[im 29/81  soft-tissue]
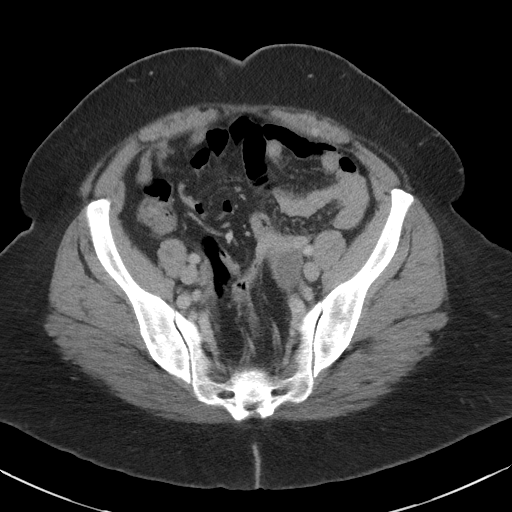
[im 37/81  soft-tissue]
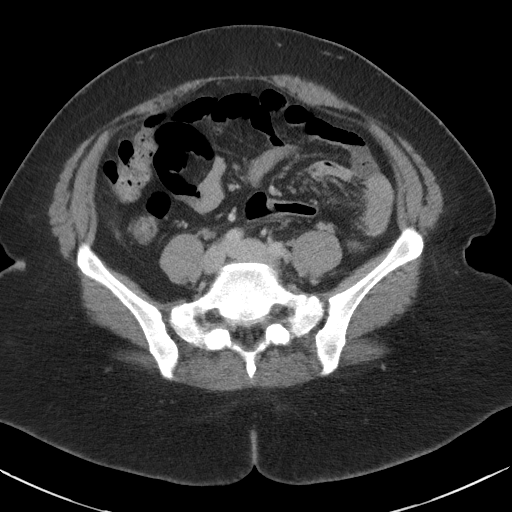
[im 41/81  soft-tissue]
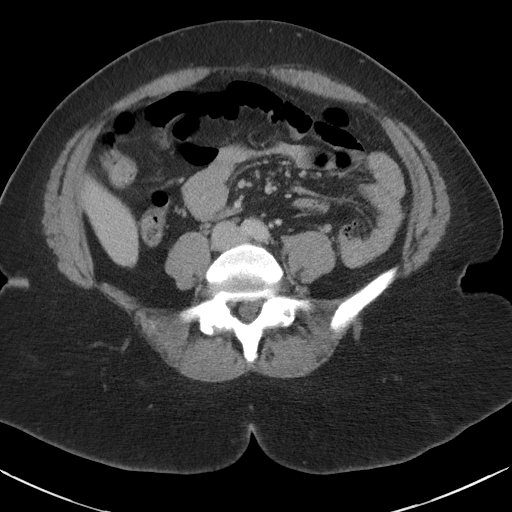
[im 45/81  soft-tissue]
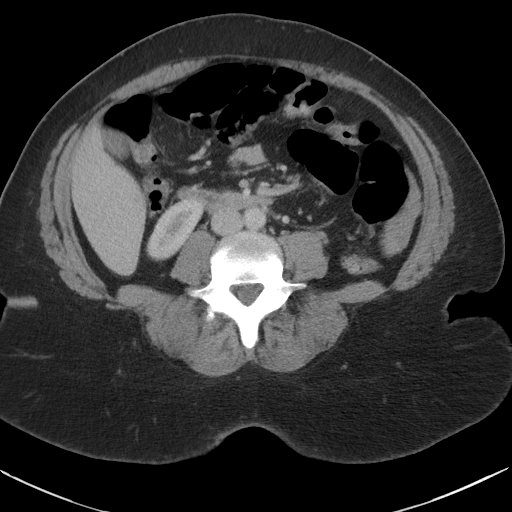
[im 53/81  soft-tissue]
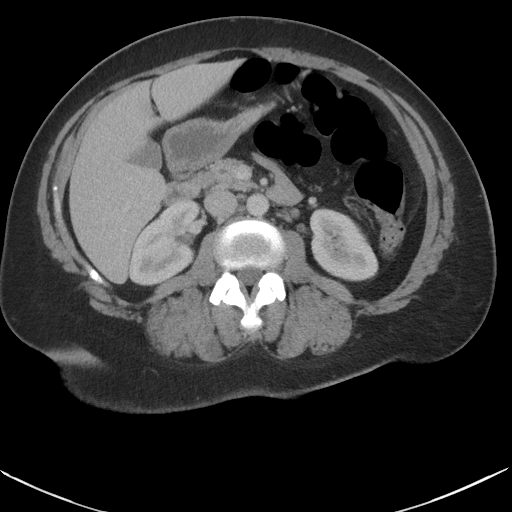
[im 53/81  bone]
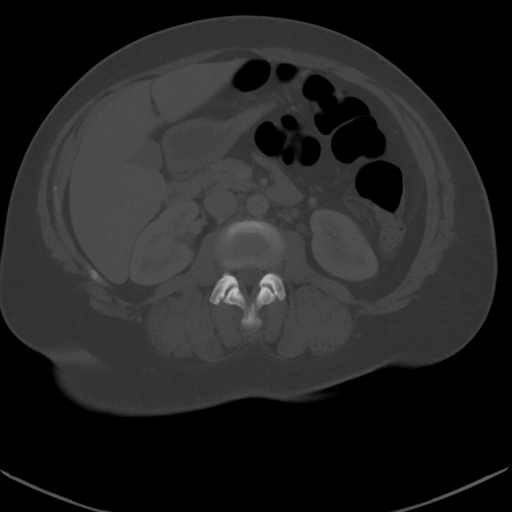
[im 57/81  soft-tissue]
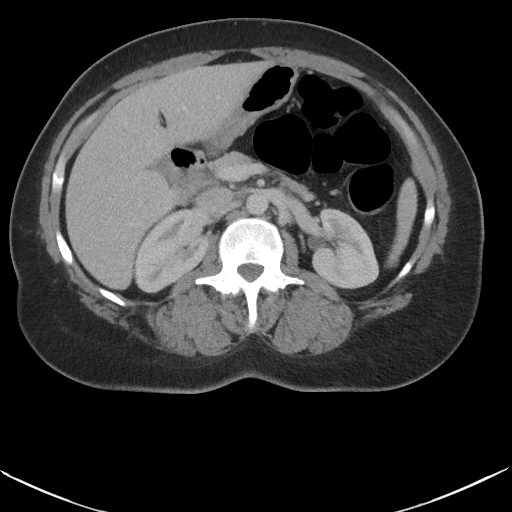
[im 65/81  soft-tissue]
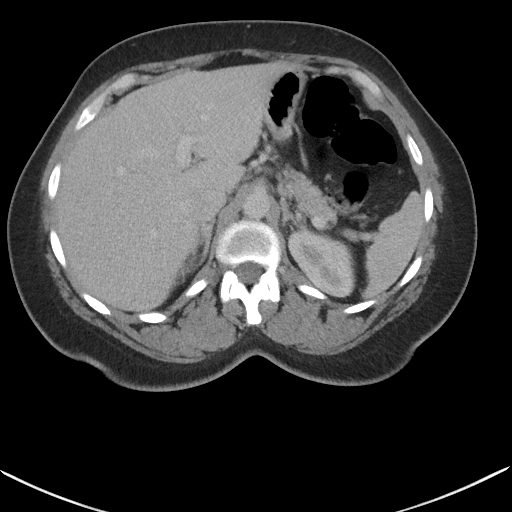
[im 69/81  soft-tissue]
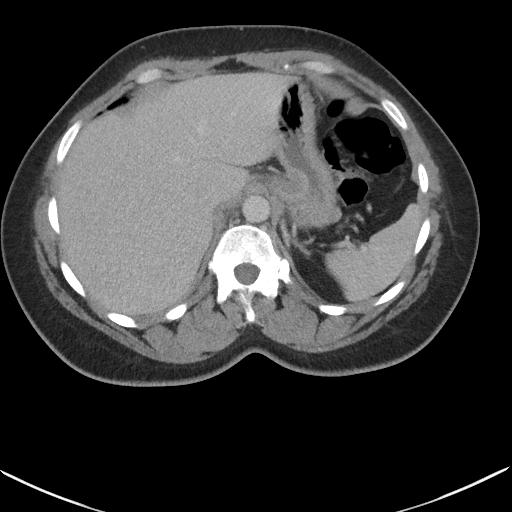
[im 77/81  soft-tissue]
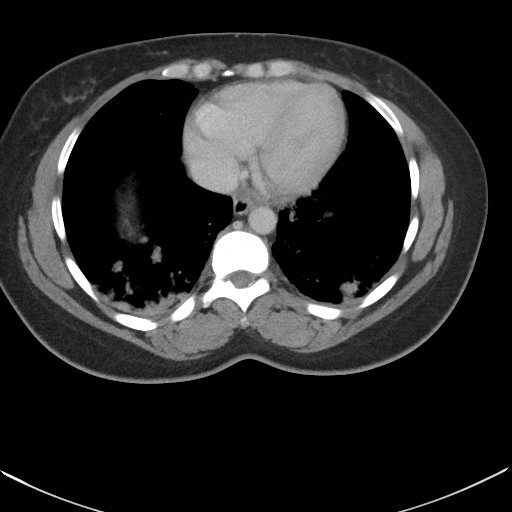

[Series 5: coronal st · coronal · 0.74mm/px · 3 of 99 slices shown]
[im 33/99  soft-tissue]
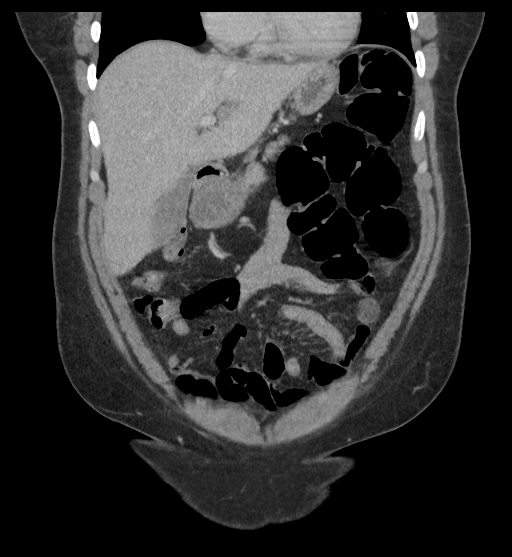
[im 44/99  soft-tissue]
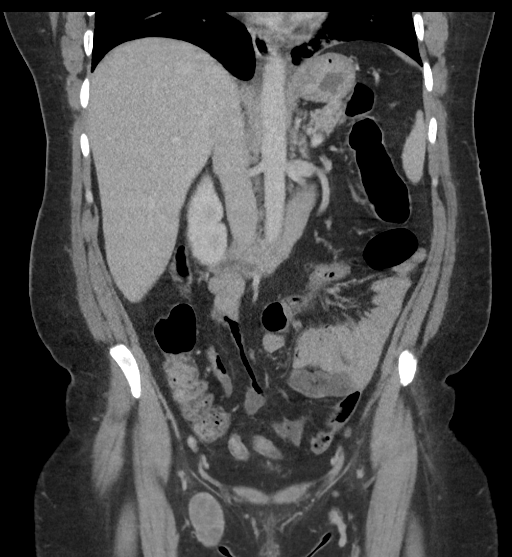
[im 55/99  soft-tissue]
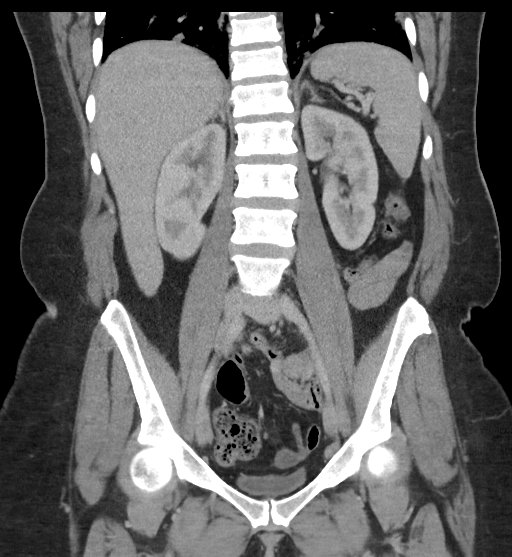

[16 of 46 positions shown; findings below may reference images not displayed]

FINDINGS: Lower chest: The visualized heart size within normal limits. No
pericardial fluid/thickening.

There is patchy rounded airspace opacities seen throughout both lung
bases.

Hepatobiliary: The liver is normal in density without focal
abnormality.The main portal vein is patent. No evidence of calcified
gallstones, gallbladder wall thickening or biliary dilatation.

Pancreas: Unremarkable. No pancreatic ductal dilatation or
surrounding inflammatory changes.

Spleen: Normal in size without focal abnormality.

Adrenals/Urinary Tract: Both adrenal glands appear normal. The
kidneys and collecting system appear normal without evidence of
urinary tract calculus or hydronephrosis. Bladder is unremarkable.

Stomach/Bowel: The small bowel and stomach are normal in appearance.
A moderate amount of colonic stool is present. Colonic diverticula
are noted without diverticulitis.

Vascular/Lymphatic: There are no enlarged mesenteric,
retroperitoneal, or pelvic lymph nodes. A small amount of free fluid
is seen within the deep pelvis. No significant vascular findings are
present.

Reproductive: The uterus and adnexa are unremarkable.

Other: A small fat and bowel containing umbilical hernia is seen. No
evidence of bowel strangulation.

Musculoskeletal: No acute or significant osseous findings.
IMPRESSION: Bilateral patchy airspace opacities throughout both lung bases.
These findings are concerning for atypical multifocal pneumonia.

Small amount pelvic ascites.

Moderate amount of colonic stool.

## 2021-08-08 ENCOUNTER — Other Ambulatory Visit: Payer: Self-pay

## 2021-08-08 ENCOUNTER — Encounter (HOSPITAL_BASED_OUTPATIENT_CLINIC_OR_DEPARTMENT_OTHER): Payer: Self-pay | Admitting: Emergency Medicine

## 2021-08-08 ENCOUNTER — Emergency Department (HOSPITAL_BASED_OUTPATIENT_CLINIC_OR_DEPARTMENT_OTHER)
Admission: EM | Admit: 2021-08-08 | Discharge: 2021-08-08 | Disposition: A | Discharge status: Home / Self Care | Payer: BLUE CROSS/BLUE SHIELD | Attending: Emergency Medicine | Admitting: Emergency Medicine

## 2021-08-08 DIAGNOSIS — M25562 Pain in left knee: Secondary | ICD-10-CM | POA: Insufficient documentation

## 2021-08-08 DIAGNOSIS — G8929 Other chronic pain: Secondary | ICD-10-CM | POA: Diagnosis not present

## 2021-08-08 MED ORDER — NAPROXEN 375 MG PO TABS
375.0000 mg | ORAL_TABLET | Freq: Two times a day (BID) | ORAL | 0 refills | Status: AC
Start: 2021-08-08 — End: 2021-08-22

## 2021-08-08 NOTE — Discharge Instructions (Addendum)
I am prescribing you an anti-inflammatory medication called naproxen.  This is similar to Aleve.  You can take this up to 2 times a day for management of your pain.  Try to take it with a small amount of food to help prevent stomach irritation.  Please continue to wear your knee brace as needed for comfort.  I would recommend elevating the leg and icing it at night.  Below is the contact information for Dr. Raeford Razor.  He has a local sports medicine doctor that is located in the same building as this emergency department.  Please give them a call in 1 to 2 weeks if your symptoms persist.  If you develop any new or worsening symptoms please do not hesitate to return to the emergency department. It was a pleasure to meet you.

## 2021-08-08 NOTE — ED Triage Notes (Signed)
Left leg pain for 2 months.  Worse for one week.  Painful to stand for long periods of time.  Pt unable to work last night.

## 2021-08-08 NOTE — ED Provider Notes (Signed)
[facility] EMERGENCY DEPARTMENT Provider Note   CSN: [id number] Arrival date & time: 08/08/21  0744     History Chief Complaint  Patient presents with   Leg Pain    Christina Holmes is a 50 y.o. female.  HPI Patient is a 50 year old female who presents to the emergency department due to left knee pain.  Symptoms started about 2 to 3 months ago.  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  She states that when standing for long periods of time her left knee pain will worsen significantly.  She states that in the morning it will also worsen after lying in bed and she will have difficulty getting out of bed due to pain and stiffness in the left knee.  She states that she has been elevating the leg which provides mild short-term relief of her symptoms but they quickly will return.  No numbness, weakness, chest pain, shortness of breath, hemoptysis.  Patient denies any recent travel, immobilization, or oral estrogen use.  No history of falls or trauma.    Past Medical History:  Diagnosis Date   Eczema    GERD (gastroesophageal reflux disease)    Headache    Hepatomegaly    Kidney calculi    Multinodular goiter    Pneumonia    Small intestinal bacterial overgrowth    Tubular adenoma of colon 02/05/2014   [facility]    Patient Active Problem List   Diagnosis Date Noted   Kidney calculi     Past Surgical History:  Procedure Laterality Date   ABDOMINAL HYSTERECTOMY       OB History   No obstetric history on file.     Family History  Problem Relation Age of Onset   Pancreatic cancer Other     Social History   Tobacco Use   Smoking status: Never   Smokeless tobacco: Never  Substance Use Topics   Alcohol use: No   Drug use: No    Home Medications Prior to Admission medications   Medication Sig Start Date End Date Taking? Authorizing  Provider  naproxen (NAPROSYN) 375 MG tablet Take 1 tablet (375 mg total) by mouth 2 (two) times daily with a meal for 14 days. 08/08/21 08/22/21 Yes Rayna Sexton, PA-C  azithromycin (ZITHROMAX) 250 MG tablet Take 1 tablet (250 mg total) by mouth daily. Take first 2 tablets together, then 1 every day until finished. 08/29/19   Joy, Shawn C, PA-C  benzonatate (TESSALON) 100 MG capsule Take 1 capsule (100 mg total) by mouth every 8 (eight) hours. 08/29/19   Joy, Shawn C, PA-C  dicyclomine (BENTYL) 20 MG tablet Take 1 tablet (20 mg total) by mouth 2 (two) times daily. 08/29/19   Joy, Shawn C, PA-C  doxycycline (VIBRAMYCIN) 100 MG capsule Take 1 capsule (100 mg total) by mouth 2 (two) times daily. 05/12/17   Harris, Abigail, PA-C  Guaifenesin 1200 MG TB12 Take 1 tablet (1,200 mg total) by mouth 2 (two) times daily. 11/29/18   Lawyer, Harrell Gave, PA-C  HYDROcodone-Chlorpheniramine 5-4 MG/5ML SOLN Take 5 mLs by mouth every 12 (twelve) hours as needed (Severe cough). 08/29/19   Joy, Shawn C, PA-C  ibuprofen (ADVIL,MOTRIN) 800 MG tablet Take 1 tablet (800 mg total) by mouth every 8 (eight) hours as needed. 11/29/18   Lawyer, Harrell Gave, PA-C  meloxicam (MOBIC) 7.5 MG tablet Take  1 tablet (7.5 mg total) by mouth daily as needed for up to 14 doses for pain. 06/30/19   Aundra Dubin, PA-C  methocarbamol (ROBAXIN) 500 MG tablet Take 1 tablet (500 mg total) by mouth every 8 (eight) hours as needed for muscle spasms. 06/13/19   Petrucelli, Samantha R, PA-C  ondansetron (ZOFRAN ODT) 4 MG disintegrating tablet Take 1 tablet (4 mg total) by mouth every 8 (eight) hours as needed for nausea or vomiting. 08/29/19   Joy, Shawn C, PA-C  promethazine (PHENERGAN) 25 MG tablet Take 1 tablet (25 mg total) by mouth every 6 (six) hours as needed for nausea or vomiting. 11/29/18   Lawyer, Harrell Gave, PA-C    Allergies    Patient has no known allergies.  Review of Systems   Review of Systems  Respiratory:  Negative for shortness  of breath.   Cardiovascular:  Negative for chest pain.  Musculoskeletal:  Positive for arthralgias, joint swelling and myalgias.  Skin:  Negative for color change and wound.  Neurological:  Negative for weakness and numbness.   Physical Exam Updated Vital Signs BP 117/83 (BP Location: Right Arm)   Pulse 79   Temp 98.1 F (36.7 C) (Oral)   Resp 16   Ht 5\' 4"  (1.626 m)   Wt 81.6 kg   LMP 04/05/2011   SpO2 100%   BMI 30.90 kg/m   Physical Exam Vitals and nursing note reviewed.  Constitutional:      General: She is not in acute distress.    Appearance: She is well-developed.  HENT:     Head: Normocephalic and atraumatic.     Right Ear: External ear normal.     Left Ear: External ear normal.  Eyes:     General: No scleral icterus.       Right eye: No discharge.        Left eye: No discharge.     Conjunctiva/sclera: Conjunctivae normal.  Neck:     Trachea: No tracheal deviation.  Cardiovascular:     Rate and Rhythm: Normal rate.  Pulmonary:     Effort: Pulmonary effort is normal. No respiratory distress.     Breath sounds: No stridor.  Abdominal:     General: There is no distension.  Musculoskeletal:        General: Tenderness present. No swelling or deformity.     Cervical back: Neck supple.     Comments: Mild to moderate tenderness noted circumferentially about the left knee that appears to be worst overlying the patella.  Full passive range of motion of the left hip, knee, and ankle.  Distal sensation intact.  2+ DP pulses.  Wiggling the toes of the left foot without difficulty.  No visible or palpable soft tissue edema noted.  No increased warmth.  Skin:    General: Skin is warm and dry.     Findings: No rash.  Neurological:     Mental Status: She is alert.     Cranial Nerves: Cranial nerve deficit: no gross deficits.   ED Results / Procedures / Treatments   Labs (all labs ordered are listed, but only abnormal results are displayed) Labs Reviewed - No data to  display  EKG None  Radiology No results found.  Procedures Procedures   Medications Ordered in ED Medications - No data to display  ED Course  I have reviewed the triage vital signs and the nursing notes.  Pertinent labs & imaging results that were available during my care of the  patient were reviewed by me and considered in my medical decision making (see chart for details).    MDM Rules/Calculators/A&P                          Patient is a 50 year old female who presents to the emergency department with chronic left knee pain for the past 2 to 3 months.  Pain appears to worsen with ambulation as well as when standing for long periods of time which she has to do most days due to her job.  Did not feel that imaging was warranted at this visit and the patient was agreeable.  Physical exam significant for mild to moderate tenderness circumferentially about the left knee.  Pain worst overlying the left patella.  Full range of motion of the left hip, knee, and ankle.  NVI distal to the left knee.  No increased warmth.  No fevers, chills.  Patient currently afebrile and not tachycardic.  Doubt septic joint at this time.  No recent travel, immobilization, estrogen use.  No tachycardia or hemoptysis.  Exam does not appear consistent with DVT at this time.  Feel that patient's symptoms are likely secondary to arthritic changes.  Will place patient in a knee sleeve.  Will discharge on a course of naproxen.  She denies a history of known kidney issues.  We will give a referral to sports medicine if she finds that her symptoms are refractory.  We discussed the RICE method.  Discussed return precautions.  Feel that she is stable for discharge at this time and she is agreeable.  Her questions were answered and she was amicable at the time of discharge.   Final Clinical Impression(s) / ED Diagnoses Final diagnoses:  Chronic pain of left knee    Rx / DC Orders ED Discharge Orders           Ordered    naproxen (NAPROSYN) 375 MG tablet  2 times daily with meals        08/08/21 0830             Rayna Sexton, PA-C 08/08/21 0844    Lennice Sites, DO 08/08/21 1103

## 2022-05-27 ENCOUNTER — Emergency Department (HOSPITAL_BASED_OUTPATIENT_CLINIC_OR_DEPARTMENT_OTHER)
Admission: EM | Admit: 2022-05-27 | Discharge: 2022-05-27 | Disposition: A | Discharge status: Home / Self Care | Payer: BLUE CROSS/BLUE SHIELD | Attending: Emergency Medicine | Admitting: Emergency Medicine

## 2022-05-27 ENCOUNTER — Other Ambulatory Visit: Payer: Self-pay

## 2022-05-27 ENCOUNTER — Encounter (HOSPITAL_BASED_OUTPATIENT_CLINIC_OR_DEPARTMENT_OTHER): Payer: Self-pay | Admitting: Emergency Medicine

## 2022-05-27 DIAGNOSIS — H9203 Otalgia, bilateral: Secondary | ICD-10-CM | POA: Diagnosis present

## 2022-05-27 DIAGNOSIS — Z79899 Other long term (current) drug therapy: Secondary | ICD-10-CM | POA: Diagnosis not present

## 2022-05-27 DIAGNOSIS — H6093 Unspecified otitis externa, bilateral: Secondary | ICD-10-CM | POA: Diagnosis not present

## 2022-05-27 HISTORY — DX: Hidradenitis suppurativa: L73.2

## 2022-05-27 MED ORDER — OFLOXACIN 0.3 % OT SOLN
10.0000 [drp] | Freq: Two times a day (BID) | OTIC | 0 refills | Status: AC
Start: 2022-05-27 — End: 2022-06-01

## 2022-05-27 MED ORDER — DOXYCYCLINE HYCLATE 100 MG PO CAPS: 100.0000 mg | ORAL_CAPSULE | Freq: Two times a day (BID) | ORAL | 0 refills | Status: AC

## 2022-05-27 NOTE — ED Triage Notes (Signed)
Pt reports bil ear pain and drainage

## 2022-05-27 NOTE — Discharge Instructions (Addendum)
You were seen today for bilateral ear pain.  Your ear wounds were sampled and cultures are pending to see what type of organism is present.  I have prescribed antibiotic eardrops and oral antibiotics.  Please fill these prescriptions and complete the prescription.  Need to follow-up with your ENT practice that saw you back in March of this year for further evaluation and management.  Since you are established with them at that time they should hopefully be able to work you in with an appointment.

## 2022-05-27 NOTE — ED Notes (Signed)
Pt has brown drainage coming from both ears and scaly and flaky skin with red area

## 2022-05-27 NOTE — ED Provider Notes (Signed)
[facility] EMERGENCY DEPARTMENT Provider Note   CSN: [id number] Arrival date & time: 05/27/22  2140     History  Chief Complaint  Patient presents with   Otalgia    Christina Holmes is a 51 y.o. female.  Patient presents to the hospital complaining of bilateral ear pain and drainage.  Patient states that she has been dealing with ear issues intermittently for the past year and is previously seen by ear nose and throat, and dermatology.  Patient's most recently she was given eardrops but is unclear as to what type were administered.  She has previously been given Bactrim, Diflucan, clindamycin, and others.  Patient's ears in March grew MRSA which was resistant to some antibiotics.  Patient denies hearing loss at this time.  Past medical history is significant for eczema, hidradenitis, GERD, multiple ear infections  HPI     Home Medications Prior to Admission medications   Medication Sig Start Date End Date Taking? Authorizing Provider  doxycycline (VIBRAMYCIN) 100 MG capsule Take 1 capsule (100 mg total) by mouth 2 (two) times daily. 05/27/22  Yes Dorothyann Peng, PA-C  ofloxacin (FLOXIN) 0.3 % OTIC solution Place 10 drops into both ears 2 (two) times daily for 5 days. 05/27/22 06/01/22 Yes Angelys Yetman, Casimer Leek, PA-C  azithromycin (ZITHROMAX) 250 MG tablet Take 1 tablet (250 mg total) by mouth daily. Take first 2 tablets together, then 1 every day until finished. 08/29/19   Joy, Shawn C, PA-C  benzonatate (TESSALON) 100 MG capsule Take 1 capsule (100 mg total) by mouth every 8 (eight) hours. 08/29/19   Joy, Shawn C, PA-C  dicyclomine (BENTYL) 20 MG tablet Take 1 tablet (20 mg total) by mouth 2 (two) times daily. 08/29/19   Joy, Shawn C, PA-C  doxycycline (VIBRAMYCIN) 100 MG capsule Take 1 capsule (100 mg total) by mouth 2 (two) times daily. 05/12/17   Harris, Abigail, PA-C  Guaifenesin 1200 MG TB12 Take 1 tablet (1,200 mg total) by mouth 2 (two) times daily. 11/29/18   Lawyer, Harrell Gave,  PA-C  HYDROcodone-Chlorpheniramine 5-4 MG/5ML SOLN Take 5 mLs by mouth every 12 (twelve) hours as needed (Severe cough). 08/29/19   Joy, Shawn C, PA-C  ibuprofen (ADVIL,MOTRIN) 800 MG tablet Take 1 tablet (800 mg total) by mouth every 8 (eight) hours as needed. 11/29/18   Lawyer, Harrell Gave, PA-C  meloxicam (MOBIC) 7.5 MG tablet Take 1 tablet (7.5 mg total) by mouth daily as needed for up to 14 doses for pain. 06/30/19   Aundra Dubin, PA-C  methocarbamol (ROBAXIN) 500 MG tablet Take 1 tablet (500 mg total) by mouth every 8 (eight) hours as needed for muscle spasms. 06/13/19   Petrucelli, Samantha R, PA-C  ondansetron (ZOFRAN ODT) 4 MG disintegrating tablet Take 1 tablet (4 mg total) by mouth every 8 (eight) hours as needed for nausea or vomiting. 08/29/19   Joy, Shawn C, PA-C  promethazine (PHENERGAN) 25 MG tablet Take 1 tablet (25 mg total) by mouth every 6 (six) hours as needed for nausea or vomiting. 11/29/18   Lawyer, Harrell Gave, PA-C      Allergies    Patient has no known allergies.    Review of Systems   Review of Systems  HENT:  Positive for ear discharge and ear pain.     Physical Exam Updated Vital Signs BP 123/85   Pulse 64   Temp 98.5 F (36.9 C) (Oral)   Resp 16   Ht '5\' 4"'$  (1.626 m)   Wt 72.6  kg   LMP 04/05/2011   SpO2 95%   BMI 27.46 kg/m  Physical Exam Vitals and nursing note reviewed.  Constitutional:      General: She is not in acute distress. HENT:     Head: Normocephalic and atraumatic.     Ears:     Comments: TM on left normal.  TM on right unable to be visualized due to swelling of ear canal.  Significant amount of scaling and what appears to be purulent discharge from both ears.  This includes the posterior auricle.    Mouth/Throat:     Mouth: Mucous membranes are moist.  Eyes:     Conjunctiva/sclera: Conjunctivae normal.  Cardiovascular:     Rate and Rhythm: Normal rate and regular rhythm.     Pulses: Normal pulses.     Heart sounds: Normal heart  sounds.  Pulmonary:     Effort: Pulmonary effort is normal.     Breath sounds: Normal breath sounds.  Musculoskeletal:        General: Normal range of motion.     Cervical back: Normal range of motion and neck supple.  Skin:    General: Skin is warm and dry.     Capillary Refill: Capillary refill takes less than 2 seconds.  Neurological:     Mental Status: She is alert and oriented to person, place, and time.     ED Results / Procedures / Treatments   Labs (all labs ordered are listed, but only abnormal results are displayed) Labs Reviewed  AEROBIC/ANAEROBIC CULTURE W GRAM STAIN (SURGICAL/DEEP WOUND)    EKG None  Radiology No results found.  Procedures Procedures    Medications Ordered in ED Medications - No data to display  ED Course/ Medical Decision Making/ A&P                           Medical Decision Making  Patient presents with concerns of bilateral ear pain.  Differential includes but is not otitis externa, otitis media, mastoiditis, and others  I reviewed the patient's past medical history which included multiple visits for the same ear complaint including treatment with multiple antibiotics  Wound culture sent for analysis  Patient does appear at this time to have otitis externa.  Her ears appear to be severely infected.  No symptoms to suggest mastoiditis at this time.  Left TM appears normal suggesting otitis media.  Unable to visualize right TM.  We will plan to discharge patient on doxycycline and ofloxacin drops.  Patient should follow-up with the ENT practice who saw her in March for further evaluation and management.        Final Clinical Impression(s) / ED Diagnoses Final diagnoses:  Otitis externa of both ears, unspecified chronicity, unspecified type    Rx / DC Orders ED Discharge Orders          Ordered    ofloxacin (FLOXIN) 0.3 % OTIC solution  2 times daily        05/27/22 2307    doxycycline (VIBRAMYCIN) 100 MG capsule  2 times  daily        05/27/22 2307              Ronny Bacon 05/27/22 2325    Malvin Johns, MD 05/28/22 1456

## 2022-06-02 LAB — AEROBIC/ANAEROBIC CULTURE W GRAM STAIN (SURGICAL/DEEP WOUND): Gram Stain: NONE SEEN

## 2022-06-03 ENCOUNTER — Telehealth (HOSPITAL_BASED_OUTPATIENT_CLINIC_OR_DEPARTMENT_OTHER): Payer: Self-pay | Admitting: *Deleted

## 2022-06-03 NOTE — Telephone Encounter (Signed)
Post ED Visit - Positive Culture Follow-up  Culture report reviewed by antimicrobial stewardship pharmacist: [facility] Team '[]'$  Elenor Quinones, Pharm.D. '[]'$  Heide Guile, Pharm.D., BCPS AQ-ID '[]'$  Parks Neptune, Pharm.D., BCPS '[]'$  Alycia Rossetti, Pharm.D., BCPS '[]'$  [place], Pharm.D., BCPS, AAHIVP '[]'$  Legrand Como, Pharm.D., BCPS, AAHIVP '[]'$  Salome Arnt, PharmD, BCPS '[]'$  Johnnette Gourd, PharmD, BCPS '[]'$  Hughes Better, PharmD, BCPS '[]'$  Leeroy Cha, PharmD '[]'$  Laqueta Linden, PharmD, BCPS '[x]'$  Lorelei Pont, PharmD  [facility] Team '[]'$  Leodis Sias, PharmD '[]'$  Lindell Spar, PharmD '[]'$  Royetta Asal, PharmD '[]'$  Graylin Shiver, Rph '[]'$  Rema Fendt) Glennon Mac, PharmD '[]'$  Arlyn Dunning, PharmD '[]'$  Netta Cedars, PharmD '[]'$  Dia Sitter, PharmD '[]'$  Leone Haven, PharmD '[]'$  Gretta Arab, PharmD '[]'$  Theodis Shove, PharmD '[]'$  Peggyann Juba, PharmD '[]'$  Reuel Boom, PharmD   Positive aerobic/anaerobic culture Treated with Doxycyclin, and no further patient follow-up is required at this time.  Christina Holmes 06/03/2022, 4:13 PM

## 2022-06-03 NOTE — Progress Notes (Signed)
ED Antimicrobial Stewardship Positive Culture Follow Up   Christina Holmes is an 51 y.o. female who presented to [facility] on 05/27/2022 with a chief complaint of  Chief Complaint  Patient presents with   Otalgia    Recent Results (from the past 720 hour(s))  Aerobic/Anaerobic Culture w Gram Stain (surgical/deep wound)     Status: None   Collection Time: 05/27/22 11:00 PM   Specimen: Ear; Wound  Result Value Ref Range Status   Specimen Description   Final    EAR Performed at [facility], [address] [place], [place] [address]    Special Requests   Final    NONE Performed at [facility], [address] [place], [place] [address]    Gram Stain   Final    NO WBC SEEN FEW GRAM POSITIVE COCCI IN CLUSTERS RARE GRAM POSITIVE RODS    Culture   Final    MODERATE METHICILLIN RESISTANT STAPHYLOCOCCUS AUREUS NO ANAEROBES ISOLATED Performed at [facility], [place] [address], [place], [place] [address]    Report Status 06/02/2022 FINAL  Final   Organism ID, Bacteria METHICILLIN RESISTANT STAPHYLOCOCCUS AUREUS  Final      Susceptibility   Methicillin resistant staphylococcus aureus - MIC*    CIPROFLOXACIN >=8 RESISTANT Resistant     ERYTHROMYCIN >=8 RESISTANT Resistant     GENTAMICIN <=0.5 SENSITIVE Sensitive     OXACILLIN >=4 RESISTANT Resistant     TETRACYCLINE <=1 SENSITIVE Sensitive     VANCOMYCIN 1 SENSITIVE Sensitive     TRIMETH/SULFA <=10 SENSITIVE Sensitive     CLINDAMYCIN <=0.25 SENSITIVE Sensitive     RIFAMPIN <=0.5 SENSITIVE Sensitive     Inducible Clindamycin NEGATIVE Sensitive     * MODERATE METHICILLIN RESISTANT STAPHYLOCOCCUS AUREUS  Aerobic/Anaerobic Culture w Gram Stain (surgical/deep wound)     Status: None   Collection Time: 05/27/22 11:31 PM   Specimen: Ear; Wound  Result Value Ref Range Status   Specimen Description   Final    EAR Performed at [facility], [address] [place], [place] [address]    Special  Requests   Final    NONE Performed at [facility], [address] [place], [place] [address]    Gram Stain   Final    RARE SQUAMOUS EPITHELIAL CELLS PRESENT FEW GRAM POSITIVE COCCI IN CLUSTERS    Culture   Final    ABUNDANT METHICILLIN RESISTANT STAPHYLOCOCCUS AUREUS NO ANAEROBES ISOLATED Performed at [facility], [address], [place], [place] [address]    Report Status 06/02/2022 FINAL  Final   Organism ID, Bacteria METHICILLIN RESISTANT STAPHYLOCOCCUS AUREUS  Final      Susceptibility   Methicillin resistant staphylococcus aureus - MIC*    CIPROFLOXACIN >=8 RESISTANT Resistant     ERYTHROMYCIN >=8 RESISTANT Resistant     GENTAMICIN <=0.5 SENSITIVE Sensitive     OXACILLIN >=4 RESISTANT Resistant     TETRACYCLINE <=1 SENSITIVE Sensitive     VANCOMYCIN 1 SENSITIVE Sensitive     TRIMETH/SULFA <=10 SENSITIVE Sensitive     CLINDAMYCIN <=0.25 SENSITIVE Sensitive     RIFAMPIN <=0.5 SENSITIVE Sensitive     Inducible Clindamycin NEGATIVE Sensitive     * ABUNDANT METHICILLIN RESISTANT STAPHYLOCOCCUS AUREUS    Called pt to notify of results. Abx prescribed are ok per culture. Pt w/ history of the same and ENT f/u is upcoming. Informed pt to notify  them of findings for their records and planning.  Pt expressed understanding of plan.  ED Provider: [place]   Lorelei Pont, PharmD, BCPS 06/03/2022 11:25 AM ED Clinical Pharmacist -  [phone]

## 2023-03-20 ENCOUNTER — Emergency Department (HOSPITAL_BASED_OUTPATIENT_CLINIC_OR_DEPARTMENT_OTHER): Payer: BLUE CROSS/BLUE SHIELD

## 2023-03-20 ENCOUNTER — Encounter (HOSPITAL_BASED_OUTPATIENT_CLINIC_OR_DEPARTMENT_OTHER): Payer: Self-pay

## 2023-03-20 ENCOUNTER — Emergency Department (HOSPITAL_BASED_OUTPATIENT_CLINIC_OR_DEPARTMENT_OTHER)
Admission: EM | Admit: 2023-03-20 | Discharge: 2023-03-20 | Disposition: A | Discharge status: Home / Self Care | Payer: BLUE CROSS/BLUE SHIELD | Attending: Emergency Medicine | Admitting: Emergency Medicine

## 2023-03-20 DIAGNOSIS — M25562 Pain in left knee: Secondary | ICD-10-CM | POA: Diagnosis present

## 2023-03-20 DIAGNOSIS — W108XXA Fall (on) (from) other stairs and steps, initial encounter: Secondary | ICD-10-CM | POA: Diagnosis not present

## 2023-03-20 NOTE — ED Triage Notes (Signed)
States fell down 6 steps outside yesterday, fell onto left knee. C/o pain/ stiffness worse with ambulation.

## 2023-03-20 NOTE — Discharge Instructions (Signed)
Recommend ice 20 minutes on every several hours as possible.  Recommend 1000 mg of Tylenol every 6 hours as needed for pain.  Recommend 600 mg ibuprofen every 8 hours as needed for pain.  Bear weight as tolerated with Ace wrap and crutches.  Follow-up with orthopedics.

## 2023-03-20 NOTE — ED Provider Notes (Signed)
[facility] Provider Note   CSN: [id number] Arrival date & time: 03/20/23  0701     History  Chief Complaint  Patient presents with   Christina Holmes    Christina Holmes is a 52 y.o. female.  Patient here with left knee pain after fall yesterday.  Normal vitals.  No fever.  Patient tripped down some stairs while doing yard work yesterday.  Landed on the left knee.  She has been ambulatory on it since but this morning she woke up with some stiffness and more discomfort with ambulation.  She did not hit her head or lose consciousness.  She has no other pain elsewhere.  The history is provided by the patient.       Home Medications Prior to Admission medications   Medication Sig Start Date End Date Taking? Authorizing Provider  azithromycin (ZITHROMAX) 250 MG tablet Take 1 tablet (250 mg total) by mouth daily. Take first 2 tablets together, then 1 every day until finished. 08/29/19   Joy, Shawn C, PA-C  benzonatate (TESSALON) 100 MG capsule Take 1 capsule (100 mg total) by mouth every 8 (eight) hours. 08/29/19   Joy, Shawn C, PA-C  dicyclomine (BENTYL) 20 MG tablet Take 1 tablet (20 mg total) by mouth 2 (two) times daily. 08/29/19   Joy, Shawn C, PA-C  doxycycline (VIBRAMYCIN) 100 MG capsule Take 1 capsule (100 mg total) by mouth 2 (two) times daily. 05/12/17   Arthor Captain, PA-C  doxycycline (VIBRAMYCIN) 100 MG capsule Take 1 capsule (100 mg total) by mouth 2 (two) times daily. 05/27/22   Darrick Grinder, PA-C  Guaifenesin 1200 MG TB12 Take 1 tablet (1,200 mg total) by mouth 2 (two) times daily. 11/29/18   Lawyer, Cristal Deer, PA-C  HYDROcodone-Chlorpheniramine 5-4 MG/5ML SOLN Take 5 mLs by mouth every 12 (twelve) hours as needed (Severe cough). 08/29/19   Joy, Shawn C, PA-C  ibuprofen (ADVIL,MOTRIN) 800 MG tablet Take 1 tablet (800 mg total) by mouth every 8 (eight) hours as needed. 11/29/18   Lawyer, Cristal Deer, PA-C  meloxicam (MOBIC) 7.5 MG tablet  Take 1 tablet (7.5 mg total) by mouth daily as needed for up to 14 doses for pain. 06/30/19   Cristie Hem, PA-C  methocarbamol (ROBAXIN) 500 MG tablet Take 1 tablet (500 mg total) by mouth every 8 (eight) hours as needed for muscle spasms. 06/13/19   Petrucelli, Samantha R, PA-C  ondansetron (ZOFRAN ODT) 4 MG disintegrating tablet Take 1 tablet (4 mg total) by mouth every 8 (eight) hours as needed for nausea or vomiting. 08/29/19   Joy, Shawn C, PA-C  promethazine (PHENERGAN) 25 MG tablet Take 1 tablet (25 mg total) by mouth every 6 (six) hours as needed for nausea or vomiting. 11/29/18   Lawyer, Cristal Deer, PA-C      Allergies    Patient has no known allergies.    Review of Systems   Review of Systems  Physical Exam Updated Vital Signs BP 136/82 (BP Location: Right Arm)   Pulse 60   Temp 98 F (36.7 C) (Oral)   Resp 18   Ht 5\' 4"  (1.626 m)   Wt 81.6 kg   LMP 04/05/2011   SpO2 100%   BMI 30.90 kg/m  Physical Exam Vitals and nursing note reviewed.  Constitutional:      General: She is not in acute distress.    Appearance: She is well-developed. She is not ill-appearing.  HENT:     Head: Normocephalic  and atraumatic.     Mouth/Throat:     Mouth: Mucous membranes are moist.  Eyes:     Extraocular Movements: Extraocular movements intact.     Conjunctiva/sclera: Conjunctivae normal.     Pupils: Pupils are equal, round, and reactive to light.  Cardiovascular:     Rate and Rhythm: Normal rate and regular rhythm.     Pulses: Normal pulses.     Heart sounds: Normal heart sounds. No murmur heard. Pulmonary:     Effort: Pulmonary effort is normal. No respiratory distress.     Breath sounds: Normal breath sounds.  Abdominal:     Palpations: Abdomen is soft.     Tenderness: There is no abdominal tenderness.  Musculoskeletal:        General: Tenderness present.     Cervical back: Normal range of motion and neck supple. No tenderness.     Comments: Tenderness to the anterior  portion of the left knee but she is able to flex and extend with some discomfort.  There is no obvious laxity of the left knee joint.  Skin:    General: Skin is warm and dry.     Capillary Refill: Capillary refill takes less than 2 seconds.  Neurological:     General: No focal deficit present.     Mental Status: She is alert and oriented to person, place, and time.     Sensory: No sensory deficit.     Motor: No weakness.     ED Results / Procedures / Treatments   Labs (all labs ordered are listed, but only abnormal results are displayed) Labs Reviewed - No data to display  EKG None  Radiology DG Knee Complete 4 Views Left  Result Date: 03/20/2023 CLINICAL DATA:  52 year old female status post fall on steps yesterday. Pain and stiffness exacerbated by walking. EXAM: LEFT KNEE - COMPLETE 4+ VIEW COMPARISON:  None Available. FINDINGS: Bone mineralization is within normal limits. No joint effusion on the lateral view. Moderate medial compartment joint space loss and degenerative spurring. Mild lateral and patellofemoral compartment degenerative spurring. Patella intact. No acute osseous abnormality identified. IMPRESSION: 1. No acute fracture or dislocation identified about the left knee. 2. Moderate medial compartment joint degeneration. Electronically Signed   By: Odessa Fleming M.D.   On: 03/20/2023 07:33    Procedures Procedures    Medications Ordered in ED Medications - No data to display  ED Course/ Medical Decision Making/ A&P                             Medical Decision Making Amount and/or Complexity of Data Reviewed Radiology: ordered.   Christina Holmes is here with left knee pain after fall.  Unremarkable vitals.  No fever.  No significant comorbidities.  She is tender on the left knee.  Differential diagnosis likely knee contusion versus less likely fracture, could be sprain or other soft tissue process.  Appears that her ligaments and tendons are grossly intact on exam.   X-ray per my review interpretation shows no obvious fracture or malalignment.  Will put her in an Ace wrap and provided crutches.  Recommend ice, Tylenol, compression, ice and follow-up with orthopedics.  Discharged in good condition.  Understands return precautions.  Neurovascular neuromuscular intact on exam.  This chart was dictated using voice recognition software.  Despite best efforts to proofread,  errors can occur which can change the documentation meaning.  Final Clinical Impression(s) / ED Diagnoses Final diagnoses:  Acute pain of left knee    Rx / DC Orders ED Discharge Orders     None         Virgina Norfolk, DO 03/20/23 [phone]

## 2023-09-21 ENCOUNTER — Emergency Department (HOSPITAL_BASED_OUTPATIENT_CLINIC_OR_DEPARTMENT_OTHER)
Admission: EM | Admit: 2023-09-21 | Discharge: 2023-09-21 | Disposition: A | Discharge status: Home / Self Care | Payer: BLUE CROSS/BLUE SHIELD | Attending: Emergency Medicine | Admitting: Emergency Medicine

## 2023-09-21 ENCOUNTER — Encounter (HOSPITAL_BASED_OUTPATIENT_CLINIC_OR_DEPARTMENT_OTHER): Payer: Self-pay

## 2023-09-21 ENCOUNTER — Other Ambulatory Visit: Payer: Self-pay

## 2023-09-21 ENCOUNTER — Emergency Department (HOSPITAL_BASED_OUTPATIENT_CLINIC_OR_DEPARTMENT_OTHER): Payer: BLUE CROSS/BLUE SHIELD

## 2023-09-21 DIAGNOSIS — M79642 Pain in left hand: Secondary | ICD-10-CM | POA: Diagnosis present

## 2023-09-21 DIAGNOSIS — Z91040 Latex allergy status: Secondary | ICD-10-CM | POA: Diagnosis not present

## 2023-09-21 MED ORDER — NAPROXEN 500 MG PO TABS: 500.0000 mg | ORAL_TABLET | Freq: Two times a day (BID) | ORAL | 0 refills | Status: AC

## 2023-09-21 MED ORDER — NAPROXEN 250 MG PO TABS
500.0000 mg | ORAL_TABLET | Freq: Once | ORAL | Status: AC
  Administered 2023-09-21: 500 mg via ORAL
  Filled 2023-09-21: qty 2

## 2023-09-21 NOTE — ED Triage Notes (Signed)
Pt arrives with c/o left hand pain that started about a month ago. Pt denies injury. Pt reports hx of arthritis.

## 2023-09-21 NOTE — ED Provider Notes (Signed)
[place] [facility]  Provider Note  CSN: [id number] Arrival date & time: 09/21/23 0048  History Chief Complaint  Patient presents with   Hand Pain    Christina Holmes is a 52 y.o. female here with L hand pain for a month, no known injury. Worse with movement. She is being evaluated by Rheum for possible Lupus, but does not know results of those tests yet. Also has history of arthritis and gout in other areas.    Home Medications Prior to Admission medications   Medication Sig Start Date End Date Taking? Authorizing Provider  naproxen (NAPROSYN) 500 MG tablet Take 1 tablet (500 mg total) by mouth 2 (two) times daily. 09/21/23  Yes Pollyann Savoy, MD  azithromycin (ZITHROMAX) 250 MG tablet Take 1 tablet (250 mg total) by mouth daily. Take first 2 tablets together, then 1 every day until finished. 08/29/19   Joy, Shawn C, PA-C  benzonatate (TESSALON) 100 MG capsule Take 1 capsule (100 mg total) by mouth every 8 (eight) hours. 08/29/19   Joy, Shawn C, PA-C  dicyclomine (BENTYL) 20 MG tablet Take 1 tablet (20 mg total) by mouth 2 (two) times daily. 08/29/19   Joy, Shawn C, PA-C  doxycycline (VIBRAMYCIN) 100 MG capsule Take 1 capsule (100 mg total) by mouth 2 (two) times daily. 05/12/17   Arthor Captain, PA-C  doxycycline (VIBRAMYCIN) 100 MG capsule Take 1 capsule (100 mg total) by mouth 2 (two) times daily. 05/27/22   Darrick Grinder, PA-C  Guaifenesin 1200 MG TB12 Take 1 tablet (1,200 mg total) by mouth 2 (two) times daily. 11/29/18   Lawyer, Cristal Deer, PA-C  HYDROcodone-Chlorpheniramine 5-4 MG/5ML SOLN Take 5 mLs by mouth every 12 (twelve) hours as needed (Severe cough). 08/29/19   Joy, Shawn C, PA-C  ibuprofen (ADVIL,MOTRIN) 800 MG tablet Take 1 tablet (800 mg total) by mouth every 8 (eight) hours as needed. 11/29/18   Lawyer, Cristal Deer, PA-C  meloxicam (MOBIC) 7.5 MG tablet Take 1 tablet (7.5 mg total) by mouth daily as needed for up to 14 doses for  pain. 06/30/19   Cristie Hem, PA-C  methocarbamol (ROBAXIN) 500 MG tablet Take 1 tablet (500 mg total) by mouth every 8 (eight) hours as needed for muscle spasms. 06/13/19   Petrucelli, Samantha R, PA-C  ondansetron (ZOFRAN ODT) 4 MG disintegrating tablet Take 1 tablet (4 mg total) by mouth every 8 (eight) hours as needed for nausea or vomiting. 08/29/19   Joy, Shawn C, PA-C  promethazine (PHENERGAN) 25 MG tablet Take 1 tablet (25 mg total) by mouth every 6 (six) hours as needed for nausea or vomiting. 11/29/18   Lawyer, Cristal Deer, PA-C     Allergies    Latex and Tape   Review of Systems   Review of Systems Please see HPI for pertinent positives and negatives  Physical Exam BP (!) 143/103   Pulse 74   Temp 98.1 F (36.7 C) (Oral)   Resp 18   Wt 90.7 kg   LMP 04/05/2011   SpO2 97%   BMI 34.33 kg/m   Physical Exam Vitals and nursing note reviewed.  HENT:     Head: Normocephalic.     Nose: Nose normal.  Eyes:     Extraocular Movements: Extraocular movements intact.  Pulmonary:     Effort: Pulmonary effort is normal.  Musculoskeletal:        General: Tenderness (soft tissue, dorsal L hand) present. No swelling or deformity. Normal range  of motion.     Cervical back: Neck supple.     Comments: No erythema or warmth  Skin:    Findings: No rash (on exposed skin).  Neurological:     Mental Status: She is alert and oriented to person, place, and time.  Psychiatric:        Mood and Affect: Mood normal.     ED Results / Procedures / Treatments   EKG None  Procedures Procedures  Medications Ordered in the ED Medications  naproxen (NAPROSYN) tablet 500 mg (has no administration in time range)    Initial Impression and Plan  Patient here with atraumatic L hand pain. Exam is reassuring. I personally viewed the images from radiology studies and agree with radiologist interpretation: Xrays are negative. Will provide a brace for support, NSAIDs for pain and recommend  continued evaluation by her outpatient doctors.   ED Course       MDM Rules/Calculators/A&P Medical Decision Making Problems Addressed: Left hand pain: chronic illness or injury  Amount and/or Complexity of Data Reviewed Radiology: ordered and independent interpretation performed. Decision-making details documented in ED Course.  Risk Prescription drug management.     Final Clinical Impression(s) / ED Diagnoses Final diagnoses:  Left hand pain    Rx / DC Orders ED Discharge Orders          Ordered    naproxen (NAPROSYN) 500 MG tablet  2 times daily        09/21/23 0328             Pollyann Savoy, MD 09/21/23 [phone]
